# Patient Record
Sex: Male | Born: 1965 | Race: Black or African American | Hispanic: No | Marital: Single | Smoking: Former smoker
Health system: Southern US, Community
[De-identification: ages and names within clinical notes are randomized; demographics above are authoritative.]

## PROBLEM LIST (undated history)

## (undated) DIAGNOSIS — K219 Gastro-esophageal reflux disease without esophagitis: Secondary | ICD-10-CM

## (undated) DIAGNOSIS — Q359 Cleft palate, unspecified: Secondary | ICD-10-CM

## (undated) DIAGNOSIS — I10 Essential (primary) hypertension: Secondary | ICD-10-CM

## (undated) DIAGNOSIS — E669 Obesity, unspecified: Secondary | ICD-10-CM

## (undated) HISTORY — DX: Obesity, unspecified: E66.9

## (undated) HISTORY — DX: Gastro-esophageal reflux disease without esophagitis: K21.9

## (undated) HISTORY — PX: FRACTURE SURGERY: SHX138

## (undated) HISTORY — DX: Cleft palate, unspecified: Q35.9

---

## 2003-07-18 HISTORY — PX: CLEFT PALATE REPAIR: SUR1165

## 2009-08-10 ENCOUNTER — Ambulatory Visit: Payer: Self-pay | Admitting: Cardiology

## 2009-08-10 ENCOUNTER — Ambulatory Visit: Payer: Self-pay | Admitting: Adult Health

## 2010-06-23 ENCOUNTER — Emergency Department (HOSPITAL_COMMUNITY): Admission: EM | Admit: 2010-06-23 | Discharge: 2009-10-30 | Payer: Self-pay | Admitting: Emergency Medicine

## 2010-10-04 LAB — BASIC METABOLIC PANEL
BUN: 10 mg/dL (ref 6–23)
Calcium: 8.8 mg/dL (ref 8.4–10.5)
Chloride: 101 mEq/L (ref 96–112)
GFR calc Af Amer: >60 mL/min (ref >60)
GFR calc non Af Amer: >60 mL/min (ref >60)
Glucose, Bld: 124 mg/dL — ABNORMAL HIGH (ref 70–99)
Potassium: 3.2 mEq/L — ABNORMAL LOW (ref 3.5–5.1)

## 2010-10-04 LAB — CBC
HCT: 42.1 % (ref 39.0–52.0)
MCHC: 34.6 g/dL (ref 30.0–36.0)
Platelets: 263 10*3/uL (ref 150–400)
RDW: 13.5 % (ref 11.5–15.5)

## 2010-10-04 LAB — DIFFERENTIAL
Basophils Absolute: 0 10*3/uL (ref 0.0–0.1)
Basophils Relative: 0 % (ref 0–1)
Eosinophils Relative: 1 % (ref 0–5)
Lymphs Abs: 4.1 10*3/uL — ABNORMAL HIGH (ref 0.7–4.0)
Neutro Abs: 2.5 10*3/uL (ref 1.7–7.7)

## 2010-10-04 LAB — POCT CARDIAC MARKERS

## 2012-04-19 ENCOUNTER — Emergency Department (HOSPITAL_COMMUNITY)
Admission: EM | Admit: 2012-04-19 | Discharge: 2012-04-19 | Disposition: A | Discharge status: Home / Self Care | Payer: Self-pay | Attending: Emergency Medicine | Admitting: Emergency Medicine

## 2012-04-19 ENCOUNTER — Encounter (HOSPITAL_COMMUNITY): Payer: Self-pay | Admitting: Emergency Medicine

## 2012-04-19 DIAGNOSIS — I10 Essential (primary) hypertension: Secondary | ICD-10-CM | POA: Insufficient documentation

## 2012-04-19 DIAGNOSIS — M549 Dorsalgia, unspecified: Secondary | ICD-10-CM | POA: Insufficient documentation

## 2012-04-19 HISTORY — DX: Essential (primary) hypertension: I10

## 2012-04-19 MED ORDER — HYDROMORPHONE HCL PF 2 MG/ML IJ SOLN
2.0000 mg | Freq: Once | INTRAMUSCULAR | Status: AC
  Administered 2012-04-19: 2 mg via INTRAMUSCULAR
  Filled 2012-04-19: qty 1

## 2012-04-19 MED ORDER — OXYCODONE-ACETAMINOPHEN 5-325 MG PO TABS: 1.0000 | ORAL_TABLET | Freq: Four times a day (QID) | ORAL | Status: DC | PRN

## 2012-04-19 MED ORDER — CYCLOBENZAPRINE HCL 10 MG PO TABS: 10.0000 mg | ORAL_TABLET | Freq: Three times a day (TID) | ORAL | Status: DC | PRN

## 2012-04-19 MED ORDER — KETOROLAC TROMETHAMINE 30 MG/ML IJ SOLN
60.0000 mg | Freq: Once | INTRAMUSCULAR | Status: AC
  Administered 2012-04-19: 60 mg via INTRAMUSCULAR
  Filled 2012-04-19: qty 1

## 2012-04-19 MED ORDER — IBUPROFEN 800 MG PO TABS: 800.0000 mg | ORAL_TABLET | Freq: Three times a day (TID) | ORAL | Status: DC | PRN

## 2012-04-19 NOTE — ED Notes (Signed)
MD at bedside. 

## 2012-04-19 NOTE — ED Provider Notes (Signed)
History     CSN: [id number]  Arrival date & time 04/19/12  0302   First MD Initiated Contact with Patient 04/19/12 0310      Chief Complaint  Patient presents with  . Back Pain    (Consider location/radiation/quality/duration/timing/severity/associated sxs/prior treatment) HPI Pt reports started a new job 2 days ago which involves more lifting, bending and pushing than he is accustomed to. States since earlier yesterday he has had worsening moderate to severe, non radiating R lower back pain, not improved with heat, ice or tylenol.   Past Medical History  Diagnosis Date  . Hypertension     History reviewed. No pertinent past surgical history.  History reviewed. No pertinent family history.  History  Substance Use Topics  . Smoking status: Never Smoker   . Smokeless tobacco: Not on file  . Alcohol Use: Yes     Ocassional      Review of Systems All other systems reviewed and are negative except as noted in HPI.   Allergies  Review of patient's allergies indicates no known allergies.  Home Medications  No current outpatient prescriptions on file.  BP 132/86  Pulse 87  Temp 97.9 F (36.6 C) (Oral)  Resp 20  SpO2 98%  Physical Exam  Nursing note and vitals reviewed. Constitutional: He is oriented to person, place, and time. He appears well-developed and well-nourished.  HENT:  Head: Normocephalic and atraumatic.  Eyes: EOM are normal. Pupils are equal, round, and reactive to light.  Neck: Normal range of motion. Neck supple.  Cardiovascular: Normal rate, normal heart sounds and intact distal pulses.   Pulmonary/Chest: Effort normal and breath sounds normal.  Abdominal: Bowel sounds are normal. He exhibits no distension. There is no tenderness.  Musculoskeletal: Normal range of motion. He exhibits tenderness. He exhibits no edema.       Tender to palpation of R lumbar paraspinal muscles. No bony tenderness or tenderness at sciatic notch  Neurological: He is  alert and oriented to person, place, and time. He has normal strength. No cranial nerve deficit or sensory deficit.  Skin: Skin is warm and dry. No rash noted.  Psychiatric: He has a normal mood and affect.    ED Course  Procedures (including critical care time)  Labs Reviewed - No data to display No results found.   No diagnosis found.    MDM  Pain improved with IM meds. Pt ready to go home. Advised rest, heat, muscle relaxers at home.        Charles B. Bernette Mayers, MD 04/19/12 [phone]

## 2012-04-19 NOTE — ED Notes (Signed)
Patient reporting lower back pain that started yesterday; patient reports that he has been putting a lot of strain on his back with his new job.  Patient reports taking Tylenol; provided little to no relief.  Denies injury.

## 2012-04-20 ENCOUNTER — Emergency Department (HOSPITAL_COMMUNITY): Payer: Self-pay

## 2012-04-20 ENCOUNTER — Encounter (HOSPITAL_COMMUNITY): Payer: Self-pay | Admitting: Emergency Medicine

## 2012-04-20 ENCOUNTER — Emergency Department (HOSPITAL_COMMUNITY)
Admission: EM | Admit: 2012-04-20 | Discharge: 2012-04-20 | Disposition: A | Discharge status: Home / Self Care | Payer: Self-pay | Attending: Emergency Medicine | Admitting: Emergency Medicine

## 2012-04-20 DIAGNOSIS — S335XXA Sprain of ligaments of lumbar spine, initial encounter: Secondary | ICD-10-CM | POA: Insufficient documentation

## 2012-04-20 DIAGNOSIS — S39012A Strain of muscle, fascia and tendon of lower back, initial encounter: Secondary | ICD-10-CM

## 2012-04-20 DIAGNOSIS — I10 Essential (primary) hypertension: Secondary | ICD-10-CM | POA: Insufficient documentation

## 2012-04-20 DIAGNOSIS — X500XXA Overexertion from strenuous movement or load, initial encounter: Secondary | ICD-10-CM | POA: Insufficient documentation

## 2012-04-20 MED ORDER — KETOROLAC TROMETHAMINE 60 MG/2ML IM SOLN
60.0000 mg | Freq: Once | INTRAMUSCULAR | Status: AC
  Administered 2012-04-20: 60 mg via INTRAMUSCULAR
  Filled 2012-04-20: qty 2

## 2012-04-20 MED ORDER — HYDROMORPHONE HCL PF 1 MG/ML IJ SOLN
1.0000 mg | Freq: Once | INTRAMUSCULAR | Status: AC
  Administered 2012-04-20: 1 mg via INTRAMUSCULAR
  Filled 2012-04-20: qty 1

## 2012-04-20 NOTE — ED Notes (Signed)
Patient complaining of lower back pain; was seen here yesterday morning for the same symptoms.  Patient reports that he was given two injections for pain, which helped alleviate the pain.  Patient reports that every night around 0200-0300 while he is lying in bed; pain returns.  Patient ambulatory in triage.

## 2012-04-20 NOTE — ED Notes (Signed)
Patient transported to X-ray 

## 2012-04-20 NOTE — ED Notes (Signed)
The patient is AOx4 and comfortable with his discharge instructions.  His ride home is present in the room.

## 2012-04-20 NOTE — ED Provider Notes (Signed)
History     CSN: [id number]  Arrival date & time 04/20/12  6644   First MD Initiated Contact with Patient 04/20/12 [phone]      Chief Complaint  Patient presents with  . Back Pain    (Consider location/radiation/quality/duration/timing/severity/associated sxs/prior treatment) HPI Comments: 46 year old male with a history of low back pain for the last week. He does recently have a switch in occupation which has left him doing some lifting and bending which he did not do before. The pain is located in the right lower back, it is not in the middle, he denies any red flags for back pain including history of cancer, IV drug use, fever, urinary retention or incontinence, weakness or numbness or gait problems. The symptoms are persistent for the last several days though they do get worse when he gets in certain positions. He has received intramuscular medications yesterday with good relief of his symptoms but at home using Percocet Flexeril and ibuprofen he has not had as good of relief. The patient denies any coughing or shortness of breath fevers or chills nausea or vomiting. He has not had a workup for back pain in the past until yesterday  Patient is a 46 y.o. male presenting with back pain. The history is provided by the patient, a relative and medical records.  Back Pain     Past Medical History  Diagnosis Date  . Hypertension     History reviewed. No pertinent past surgical history.  History reviewed. No pertinent family history.  History  Substance Use Topics  . Smoking status: Never Smoker   . Smokeless tobacco: Not on file  . Alcohol Use: Yes     Ocassional      Review of Systems  Musculoskeletal: Positive for back pain.  All other systems reviewed and are negative.    Allergies  Review of patient's allergies indicates no known allergies.  Home Medications   Current Outpatient Rx  Name Route Sig Dispense Refill  . CYCLOBENZAPRINE HCL 10 MG PO TABS Oral Take 1  tablet (10 mg total) by mouth 3 (three) times daily as needed for muscle spasms. 30 tablet 0  . HYDROCHLOROTHIAZIDE PO Oral Take 1 tablet by mouth daily.    . IBUPROFEN 800 MG PO TABS Oral Take 1 tablet (800 mg total) by mouth every 8 (eight) hours as needed for pain. 30 tablet 0  . LISINOPRIL PO Oral Take 1 tablet by mouth daily.    . OXYCODONE-ACETAMINOPHEN 5-325 MG PO TABS Oral Take 1-2 tablets by mouth every 6 (six) hours as needed for pain. 20 tablet 0  . RANITIDINE HCL 150 MG PO TABS Oral Take 150 mg by mouth 2 (two) times daily.      BP 121/72  Pulse 80  Temp 98 F (36.7 C) (Oral)  Resp 22  Ht 6' (1.829 m)  Wt 285 lb (129.275 kg)  BMI 38.65 kg/m2  SpO2 98%  Physical Exam  Nursing note and vitals reviewed. Constitutional: He appears well-developed and well-nourished. No distress.  HENT:  Head: Normocephalic and atraumatic.  Mouth/Throat: Oropharynx is clear and moist. No oropharyngeal exudate.  Eyes: Conjunctivae normal and EOM are normal. Pupils are equal, round, and reactive to light. Right eye exhibits no discharge. Left eye exhibits no discharge. No scleral icterus.  Neck: Normal range of motion. Neck supple. No JVD present. No thyromegaly present.  Cardiovascular: Normal rate, regular rhythm, normal heart sounds and intact distal pulses.  Exam reveals no gallop and no friction  rub.   No murmur heard. Pulmonary/Chest: Effort normal and breath sounds normal. No respiratory distress. He has no wheezes. He has no rales.  Abdominal: Soft. Bowel sounds are normal. He exhibits no distension and no mass. There is no tenderness.  Musculoskeletal: Normal range of motion. He exhibits tenderness. He exhibits no edema.       Tender to palpation on the right lower back in the paraspinal muscles in the right buttock. No tenderness over the cervical thoracic or lumbar spines.  Lymphadenopathy:    He has no cervical adenopathy.  Neurological: He is alert. Coordination normal.        Reflexes normal at the knees bilaterally, normal strength and sensation to light touch and pinprick of the bilateral lower extremities.  Skin: Skin is warm and dry. No rash noted. No erythema.  Psychiatric: He has a normal mood and affect. His behavior is normal.    ED Course  Procedures (including critical care time)  Labs Reviewed - No data to display Dg Lumbar Spine Complete  04/20/2012  *RADIOLOGY REPORT*  Clinical Data: Low back and right leg pain  LUMBAR SPINE - COMPLETE 4+ VIEW  Comparison: None  Findings: There is mild concavity of the endplates throughout the lumbar spine.  No acute fracture.  Normal alignment.  Disc heights maintained throughout.  IMPRESSION:  No acute abnormality   Original Report Authenticated By: Osa Craver, M.D.      1. Lumbar strain       MDM  The patient has a normal neurologic exam, a history that does not suggest a malignant source of his symptoms, vital signs are normal without fever or tachycardia, pain medication ordered, leg radiographs of the lumbar spine pending.   X-rays are negative for acute findings, the patient has had near complete improvement with intramuscular medications. Reassurance given that this is unlikely to be a spinal cord problem however neurosurgical followup given for spine referral should his symptoms continue, the patient has been explained the red flags for back pain and has expressed his understanding as the indications for return.  Vida Roller, MD 04/20/12 [phone]

## 2013-01-06 ENCOUNTER — Ambulatory Visit (INDEPENDENT_AMBULATORY_CARE_PROVIDER_SITE_OTHER): Payer: No Typology Code available for payment source | Admitting: Physician Assistant

## 2013-01-06 ENCOUNTER — Encounter: Payer: Self-pay | Admitting: Physician Assistant

## 2013-01-06 VITALS — BP 116/80 | HR 86 | Temp 98.3°F | Resp 16 | Ht 72.0 in | Wt 276.2 lb

## 2013-01-06 DIAGNOSIS — K219 Gastro-esophageal reflux disease without esophagitis: Secondary | ICD-10-CM

## 2013-01-06 DIAGNOSIS — F1911 Other psychoactive substance abuse, in remission: Secondary | ICD-10-CM

## 2013-01-06 DIAGNOSIS — I10 Essential (primary) hypertension: Secondary | ICD-10-CM

## 2013-01-06 DIAGNOSIS — F1910 Other psychoactive substance abuse, uncomplicated: Secondary | ICD-10-CM

## 2013-01-06 NOTE — Progress Notes (Signed)
Patient ID: Stephen Spears MRN: [id number], DOB: [birth year], 47 y.o. Date of Encounter: 01/06/2013, 4:04 PM  Primary Physician: Default, Provider, MD  Chief Complaint: Establish care  HPI: 47 y.o. male with history below presents to establish care. History of well controlled hypertension and cocaine abuse 15 years prior.   1) Hypertension diagnosed about 10 years earlier. Doing well on current regimen of HCTZ and lisinopril. No side effects. Takes daily. Blood pressure well controlled. 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 No chest pain, chest tightness, HA, vision changes, or focal deficits.   2) History of substance abuse. He used cocaine for a while 15 years ago. "Morning, noon, and night." He decided when his daughter was born it was time to get his life turned around. He has not touched an illegal substance since. He does drink about 2 beers per week. No tobacco use.   3) Establish care: [facility] at a [facility]. He saw different providers each time. He would like to know his cholesterol. He is not fasting today. Plans to schedule a CPE. States his last CPE was about a year ago.      Past Medical History  Diagnosis Date  . Hypertension      Home Meds: Prior to Admission medications   Medication Sig Start Date End Date Taking? Authorizing Provider         HYDROCHLOROTHIAZIDE PO Take 1 tablet by mouth daily.   Yes Historical Provider, MD         LISINOPRIL PO Take 1 tablet by mouth daily.   Yes Historical Provider, MD         ranitidine (ZANTAC) 150 MG tablet Take 150 mg by mouth 2 (two) times daily.   Yes Historical Provider, MD    Allergies: No Known Allergies  History   Social History  . Marital Status: Single    Spouse Name: N/A    Number of Children: N/A  . Years of Education: N/A   Occupational History  . Not on file.   Social History Main Topics  . Smoking status:  Never Smoker   . Smokeless tobacco: Never Used  . Alcohol Use: 0 - 1 oz/week    0-2 drink(s) per week     Comment: Ocassional  . 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   Marland Kitchen Sexually Active: Not on file   Other Topics Concern  . Not on file   Social History Narrative  . No narrative on file     Review of Systems: Constitutional: negative for chills, fever, or fatigue  HEENT: negative for vision changes, hearing loss, congestion, rhinorrhea, ST, epistaxis, or sinus pressure Cardiovascular: negative for chest pain or palpitations Respiratory: negative for wheezing, shortness of breath, or cough Abdominal: negative for abdominal pain, nausea, vomiting, or diarrhea Dermatological: negative for rash Neurologic: negative for headache, dizziness, or syncope   Physical Exam: Blood pressure 116/80, pulse 86, temperature 98.3 F (36.8 C), temperature source Oral, resp. rate 16, height 6' (1.829 m), weight 276 lb 3.2 oz (125.283 kg), SpO2 97.00%., Body mass index is 37.45 kg/(m^2). General: Well developed, well nourished, in no acute distress. Head: Normocephalic, atraumatic, eyes without discharge, sclera non-icteric, nares are without discharge. Bilateral auditory canals clear, TM's are without perforation, pearly grey and translucent with reflective cone of light bilaterally. Cleft palate repair. Birth  mark left side of face. Oral cavity moist, posterior pharynx without exudate, erythema, peritonsillar abscess, or post nasal drip.  Neck: Supple. No thyromegaly. Full ROM. No lymphadenopathy. Lungs: Clear bilaterally to auscultation without wheezes, rales, or rhonchi. Breathing is unlabored. Heart: RRR with S1 S2. No murmurs, rubs, or gallops appreciated. Msk:  Strength and tone normal for age. Extremities/Skin: Warm and dry. No clubbing or cyanosis. No edema. No rashes or suspicious lesions. Neuro: Alert and oriented X 3. Moves all extremities spontaneously.  Gait is normal. CNII-XII grossly in tact. Psych:  Responds to questions appropriately with a normal affect.   Labs:  CMP and lipid pending Patient is not fasting He requested lipid panel  ASSESSMENT AND PLAN:  47 y.o. male with hypertension and history of cocaine abuse here to establish care.  1) Hypertension -Well controlled -Refilled HCTZ 25 mg 1 po daily #30 RF 5 -Refilled Lisinopril 40 mg 1 po daily #30 RF 5 -Healthy diet and exercise -Weight loss  2) History of cocaine abuse -Clean for 15 years -Will continue to hold beta blockers in case of relapse   3) Establish care -Schedule CPE -Await labs   Signed, Eula Listen, PA-C 01/06/2013 4:04 PM

## 2013-01-07 LAB — COMPREHENSIVE METABOLIC PANEL
ALT: 48 U/L (ref 0–53)
Albumin: 4.7 g/dL (ref 3.5–5.2)
BUN: 16 mg/dL (ref 6–23)
CO2: 27 mEq/L (ref 19–32)
Chloride: 97 mEq/L (ref 96–112)
Sodium: 135 mEq/L (ref 135–145)
Total Protein: 7.9 g/dL (ref 6.0–8.3)

## 2013-01-07 LAB — LIPID PANEL
HDL: 40 mg/dL (ref >39)
LDL Cholesterol: 108 mg/dL — ABNORMAL HIGH (ref 0–99)
Triglycerides: 275 mg/dL — ABNORMAL HIGH (ref <150)

## 2013-01-13 ENCOUNTER — Ambulatory Visit: Payer: No Typology Code available for payment source | Admitting: Physician Assistant

## 2013-01-27 ENCOUNTER — Ambulatory Visit (INDEPENDENT_AMBULATORY_CARE_PROVIDER_SITE_OTHER): Payer: No Typology Code available for payment source | Admitting: Physician Assistant

## 2013-01-27 ENCOUNTER — Encounter: Payer: Self-pay | Admitting: Physician Assistant

## 2013-01-27 VITALS — BP 124/78 | HR 87 | Temp 98.2°F | Resp 16 | Ht 71.0 in | Wt 275.0 lb

## 2013-01-27 DIAGNOSIS — I10 Essential (primary) hypertension: Secondary | ICD-10-CM

## 2013-01-27 DIAGNOSIS — K219 Gastro-esophageal reflux disease without esophagitis: Secondary | ICD-10-CM

## 2013-01-27 NOTE — Progress Notes (Signed)
Patient ID: Stephen Spears MRN: [id number], DOB: 12-18-1965, 47 y.o. Date of Encounter: 01/27/2013, 2:54 PM  Primary Physician: Default, Provider, MD  Chief Complaint: Follow up  HPI: 47 y.o. male with history below presents for follow up of hypertension and to have a fasting lipid panel drawn. Doing well. No issues or complaints. Tolerating antihypertensives without issues. No chest pain, chest tightness, headaches, vision changes, or focal deficits. Does not yet need a refill of his medications.   Fasting for FLP. Would like to know what his lipids are. Usually walks each morning, however he has not walked this past week secondary to his work schedule.     Past Medical History  Diagnosis Date  . Hypertension   . Cleft palate      Home Meds: Prior to Admission medications   Medication Sig Start Date End Date Taking? Authorizing Provider  HYDROCHLOROTHIAZIDE PO Take 1 tablet by mouth daily.   Yes Historical Provider, MD  LISINOPRIL PO Take 1 tablet by mouth daily.   Yes Historical Provider, MD  ranitidine (ZANTAC) 150 MG tablet Take 150 mg by mouth 2 (two) times daily.   Yes Historical Provider, MD    Allergies: No Known Allergies  History   Social History  . Marital Status: Single    Spouse Name: N/A    Number of Children: N/A  . Years of Education: N/A   Occupational History  . Not on file.   Social History Main Topics  . Smoking status: Never Smoker   . Smokeless tobacco: Never Used  . Alcohol Use: 0 - 1 oz/week    0-2 drink(s) per week     Comment: Ocassional  . 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   Marland Kitchen Sexually Active: Not on file   Other Topics Concern  . Not on file   Social History Narrative  . No narrative on file     Review of Systems: Constitutional: negative for chills, fever, or fatigue  HEENT: negative for vision changes or hearing loss Cardiovascular: negative for chest pain, chest tightness, edema,  orthopnea, or palpitations Respiratory: negative for wheezing, shortness of breath, or cough Abdominal: negative for abdominal pain, nausea, vomiting, or diarrhea Dermatological: negative for rash Neurologic: negative for headache, dizziness, or syncope   Physical Exam: Blood pressure 124/78, pulse 87, temperature 98.2 F (36.8 C), temperature source Oral, resp. rate 16, height 5\' 11"  (1.803 m), weight 275 lb (124.739 kg), SpO2 98.00%., Body mass index is 38.37 kg/(m^2). General: Well developed, well nourished, in no acute distress. Head: Normocephalic, atraumatic, eyes without discharge, sclera non-icteric, nares are without discharge. Bilateral auditory canals clear, TM's are without perforation, pearly grey and translucent with reflective cone of light bilaterally. Oral cavity moist, posterior pharynx without exudate, erythema, peritonsillar abscess, or post nasal drip.  Neck: Supple. No thyromegaly. Full ROM. No lymphadenopathy. Lungs: Clear bilaterally to auscultation without wheezes, rales, or rhonchi. Breathing is unlabored. Heart: RRR with S1 S2. No murmurs, rubs, or gallops appreciated. Msk:  Strength and tone normal for age. Extremities/Skin: Warm and dry. No clubbing or cyanosis. No edema. No rashes or suspicious lesions. Neuro: Alert and oriented X 3. Moves all extremities spontaneously. Gait is normal. CNII-XII grossly in tact. Psych:  Responds to questions appropriately with a normal affect.   Labs:  FLP pending  ASSESSMENT AND PLAN:  47 y.o. male with hypertension, reflux, and prior history of cocaine abuse.   1) Hypertension -  Well controlled -Continue current treatment -Healthy diet and exercise -Weight loss  2) Reflux -Well controlled -Continue current treatment  3) Prior history of cocaine abuse -Clean for 15 years -Will continue to hold beta blockers in case of relapse  4) Schedule CPE -Await fasting lipids -Healthy lifestyle choices    Signed, Eula Listen, PA-C 01/27/2013 2:54 PM

## 2013-01-28 LAB — LIPID PANEL
Cholesterol: 172 mg/dL (ref 0–200)
HDL: 39 mg/dL — ABNORMAL LOW (ref >39)
LDL Cholesterol: 97 mg/dL (ref 0–99)
Total CHOL/HDL Ratio: 4.4 Ratio
VLDL: 36 mg/dL (ref 0–40)

## 2013-02-03 ENCOUNTER — Other Ambulatory Visit: Payer: Self-pay | Admitting: Physician Assistant

## 2013-02-03 DIAGNOSIS — I10 Essential (primary) hypertension: Secondary | ICD-10-CM

## 2013-02-03 MED ORDER — HYDROCHLOROTHIAZIDE 25 MG PO TABS: 25.0000 mg | ORAL_TABLET | Freq: Every day | ORAL | Status: DC

## 2013-02-03 MED ORDER — LISINOPRIL 20 MG PO TABS: 20.0000 mg | ORAL_TABLET | Freq: Every day | ORAL | Status: DC

## 2013-02-03 NOTE — Progress Notes (Signed)
Patient would like to have refills on file at [facility].

## 2013-03-12 ENCOUNTER — Other Ambulatory Visit: Payer: Self-pay

## 2013-03-12 MED ORDER — RANITIDINE HCL 150 MG PO TABS: 150.0000 mg | ORAL_TABLET | Freq: Two times a day (BID) | ORAL | Status: DC

## 2013-05-22 ENCOUNTER — Other Ambulatory Visit: Payer: Self-pay

## 2013-06-09 ENCOUNTER — Encounter: Payer: Self-pay | Admitting: Physician Assistant

## 2013-06-09 ENCOUNTER — Ambulatory Visit (INDEPENDENT_AMBULATORY_CARE_PROVIDER_SITE_OTHER): Payer: No Typology Code available for payment source | Admitting: Physician Assistant

## 2013-06-09 ENCOUNTER — Other Ambulatory Visit: Payer: Self-pay | Admitting: Physician Assistant

## 2013-06-09 VITALS — BP 110/82 | HR 88 | Temp 98.3°F | Resp 16 | Ht 71.5 in | Wt 277.7 lb

## 2013-06-09 DIAGNOSIS — Z23 Encounter for immunization: Secondary | ICD-10-CM

## 2013-06-09 DIAGNOSIS — E781 Pure hyperglyceridemia: Secondary | ICD-10-CM

## 2013-06-09 DIAGNOSIS — K219 Gastro-esophageal reflux disease without esophagitis: Secondary | ICD-10-CM

## 2013-06-09 DIAGNOSIS — I10 Essential (primary) hypertension: Secondary | ICD-10-CM

## 2013-06-09 LAB — COMPREHENSIVE METABOLIC PANEL
ALT: 56 U/L — ABNORMAL HIGH (ref 0–53)
Albumin: 4.8 g/dL (ref 3.5–5.2)
BUN: 12 mg/dL (ref 6–23)
Chloride: 96 mEq/L (ref 96–112)
Creat: 1.15 mg/dL (ref 0.50–1.35)
Potassium: 3.9 mEq/L (ref 3.5–5.3)
Total Bilirubin: 1.7 mg/dL — ABNORMAL HIGH (ref 0.3–1.2)

## 2013-06-09 LAB — LIPID PANEL
Cholesterol: 186 mg/dL (ref 0–200)
HDL: 41 mg/dL (ref >39)
Total CHOL/HDL Ratio: 4.5 Ratio
Triglycerides: 172 mg/dL — ABNORMAL HIGH (ref <150)

## 2013-06-09 MED ORDER — RANITIDINE HCL 150 MG PO TABS: 150.0000 mg | ORAL_TABLET | Freq: Two times a day (BID) | ORAL | Status: DC

## 2013-06-09 MED ORDER — LISINOPRIL 20 MG PO TABS: 20.0000 mg | ORAL_TABLET | Freq: Every day | ORAL | Status: DC

## 2013-06-09 MED ORDER — HYDROCHLOROTHIAZIDE 25 MG PO TABS: 25.0000 mg | ORAL_TABLET | Freq: Every day | ORAL | Status: DC

## 2013-06-09 NOTE — Progress Notes (Signed)
Patient ID: Stephen Spears MRN: [id number], DOB: [birth year], 47 y.o. Date of Encounter: 06/09/2013, 4:38 PM  Primary Physician: Default, Provider, MD  Chief Complaint: Medication refill  HPI: 47 y.o. male with history below presents for hypertension follow up and to have a fasting lipid panel drawn. Doing well. No issues or complaints. Tolerating antihypertensives without issues. No chest pain, chest tightness, headaches, vision changes, or focal deficits. Needs refills on all of his medications. Diet consists of mix of healthy foods and fast foods. He plans to restart his walking program that he was doing back at the beginning of the summer. He recently changed jobs. He enjoys his new job considerably more.   He started taking fish oil back in July secondary to hypertriglyceridemia. Currently fasting. Reflux is well controlled.    Past Medical History  Diagnosis Date  . Hypertension   . Cleft palate      Home Meds: Prior to Admission medications   Medication Sig Start Date End Date Taking? Authorizing Provider  hydrochlorothiazide (HYDRODIURIL) 25 MG tablet Take 1 tablet (25 mg total) by mouth daily. 06/09/13  Yes Ryan M Dunn, PA-C  lisinopril (PRINIVIL,ZESTRIL) 20 MG tablet Take 1 tablet (20 mg total) by mouth daily. 06/09/13  Yes Ryan M Dunn, PA-C  ranitidine (ZANTAC) 150 MG tablet Take 1 tablet (150 mg total) by mouth 2 (two) times daily. 06/09/13  Yes Ryan M Dunn, PA-C    Allergies: No Known Allergies  History   Social History  . Marital Status: Single    Spouse Name: N/A    Number of Children: N/A  . Years of Education: N/A   Occupational History  . Not on file.   Social History Main Topics  . Smoking status: Never Smoker   . Smokeless tobacco: Never Used  . Alcohol Use: 0 - 1 oz/week    0-2 drink(s) per week     Comment: Ocassional  . 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   Marland Kitchen Sexual Activity: Not on file   Other Topics  Concern  . Not on file   Social History Narrative  . No narrative on file     Family History  Problem Relation Age of Onset  . Hypertension Mother   . Thyroid disease Mother   . Alcohol abuse Father   . Alcohol abuse Sister     Review of Systems: Constitutional: negative for chills, fever, night sweats, weight changes, or fatigue  HEENT: negative for vision changes, hearing loss, congestion, rhinorrhea, ST, or sinus pressure Cardiovascular: negative for chest pain, palpitations, or DOE Respiratory: negative for wheezing, shortness of breath, or cough Abdominal: negative for abdominal pain, nausea, vomiting, diarrhea, or constipation Dermatological: negative for rash Neurologic: negative for headache, dizziness, or syncope   Physical Exam: Blood pressure 110/82, pulse 88, temperature 98.3 F (36.8 C), temperature source Oral, resp. rate 16, height 5' 11.5" (1.816 m), weight 277 lb 11.2 oz (125.964 kg), SpO2 98.00%., Body mass index is 38.2 kg/(m^2). General: Well developed, well nourished, in no acute distress. Head: Normocephalic, atraumatic, eyes without discharge, sclera non-icteric, nares are without discharge. Bilateral auditory canals clear, TM's are without perforation, pearly grey and translucent with reflective cone of light bilaterally. Oral cavity moist, posterior pharynx without exudate, erythema, peritonsillar abscess, or post nasal drip. Cleft palate.   Neck: Supple. No thyromegaly. Full ROM. No lymphadenopathy. No carotid bruits. Lungs: Clear bilaterally to auscultation without wheezes, rales, or rhonchi.  Breathing is unlabored. Heart: RRR with S1 S2. No murmurs, rubs, or gallops appreciated.  Msk:  Strength and tone normal for age. Extremities/Skin: Warm and dry. No clubbing or cyanosis. No edema. No rashes or suspicious lesions. Birth mark along left side of his face.  Neuro: Alert and oriented X 3. Moves all extremities spontaneously. Gait is normal. CNII-XII  grossly in tact. DTR 2+, cerebellar function intact. Rhomberg normal. Psych:  Responds to questions appropriately with a normal affect.   Labs:  CMP and lipid pending  ASSESSMENT AND PLAN:  47 y.o. male with well controlled hypertension and reflux  1) Hypertension -Well controlled -Continue current medications -HCTZ 25 mg 1 po daily #90 RF1 -Lisinopril 20 mg 1 po daily #90 RF 1 -Weight loss -Healthy diet and exercise -Influenza vaccine today  2) Reflux  -Well controlled -Zantac 150 mg 1 po bid #180 RF 1  3) Hypertriglyceridemia -Await lipid panel -Continue fish oil   Signed, Eula Listen, PA-C [facility] [place], [place] [address] [phone] 06/09/2013 4:38 PM

## 2013-07-07 ENCOUNTER — Encounter: Payer: Self-pay | Admitting: Physician Assistant

## 2013-07-07 ENCOUNTER — Ambulatory Visit (INDEPENDENT_AMBULATORY_CARE_PROVIDER_SITE_OTHER): Payer: No Typology Code available for payment source | Admitting: Physician Assistant

## 2013-07-07 VITALS — BP 112/76 | HR 81 | Temp 98.2°F | Resp 16 | Ht 71.0 in | Wt 273.2 lb

## 2013-07-07 DIAGNOSIS — I10 Essential (primary) hypertension: Secondary | ICD-10-CM

## 2013-07-07 DIAGNOSIS — E669 Obesity, unspecified: Secondary | ICD-10-CM

## 2013-07-07 DIAGNOSIS — R17 Unspecified jaundice: Secondary | ICD-10-CM

## 2013-07-07 LAB — COMPREHENSIVE METABOLIC PANEL
AST: 31 U/L (ref 0–37)
Albumin: 4.5 g/dL (ref 3.5–5.2)
BUN: 18 mg/dL (ref 6–23)
Calcium: 9.6 mg/dL (ref 8.4–10.5)
Glucose, Bld: 101 mg/dL — ABNORMAL HIGH (ref 70–99)
Potassium: 4.1 mEq/L (ref 3.5–5.3)
Total Bilirubin: 0.8 mg/dL (ref 0.3–1.2)
Total Protein: 7.2 g/dL (ref 6.0–8.3)

## 2013-07-07 LAB — BILIRUBIN,DIRECT & INDIRECT (FRACTIONATED)
Bilirubin, Direct: 0.2 mg/dL (ref 0.0–0.3)
Indirect Bilirubin: 0.6 mg/dL (ref 0.0–0.9)

## 2013-07-07 NOTE — Progress Notes (Signed)
Patient ID: Stephen Spears MRN: [id number], DOB: 10/24/1965, 47 y.o. Date of Encounter: 07/07/2013, 1:04 PM  Primary Physician: Default, Provider, MD  Chief Complaint: Follow up  HPI: 47 y.o. male with history below presents for follow up of elevated bilirubin of 1.7 that was found on 06/09/13. Fractionated bilirubin revealed an indirect bilirubin of 1.4 and a direct bilirubin of 0.3. At that time he was advised to avoid all ETOH and Tylenol and follow up in 3-4 weeks.  He has stopped all alcohol for the past month. At baseline he would drink 1-3 drinks every 1-2 weeks. He has been eating a lot more healthy and exercising regularly. He has lost 4 pounds since our last visit on 06/09/13 and he feels better. He denies any jaundice.    Past Medical History  Diagnosis Date  . Hypertension   . Cleft palate   . Obesity      Home Meds: Prior to Admission medications   Medication Sig Start Date End Date Taking? Authorizing Provider  hydrochlorothiazide (HYDRODIURIL) 25 MG tablet Take 1 tablet (25 mg total) by mouth daily. 06/09/13  Yes Luci Bellucci M Cece Milhouse, PA-C  lisinopril (PRINIVIL,ZESTRIL) 20 MG tablet Take 1 tablet (20 mg total) by mouth daily. 06/09/13  Yes Stanly Si M Aldrick Derrig, PA-C  ranitidine (ZANTAC) 150 MG tablet Take 1 tablet (150 mg total) by mouth 2 (two) times daily. 06/09/13  Yes Andilynn Delavega M Teddie Curd, PA-C    Allergies: No Known Allergies  History   Social History  . Marital Status: Single    Spouse Name: N/A    Number of Children: N/A  . Years of Education: N/A   Occupational History  . Not on file.   Social History Main Topics  . Smoking status: Never Smoker   . Smokeless tobacco: Never Used  . Alcohol Use: 0 - 1 oz/week    0-2 drink(s) per week     Comment: Ocassional  . 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   Marland Kitchen Sexual Activity: Not on file   Other Topics Concern  . Not on file   Social History Narrative  . No narrative on file     Review of  Systems: Constitutional: negative for chills, fever, or fatigue  HEENT: negative for vision changes, hearing loss, congestion, rhinorrhea, ST, or sinus pressure Cardiovascular: negative for chest pain or palpitations Respiratory: negative for wheezing, shortness of breath, or cough Abdominal: negative for abdominal pain, nausea, vomiting, diarrhea, or constipation Dermatological: negative for rash Neurologic: negative for headache, dizziness, or syncope   Physical Exam: Blood pressure 112/76, pulse 81, temperature 98.2 F (36.8 C), temperature source Oral, resp. rate 16, height 5\' 11"  (1.803 m), weight 273 lb 3.2 oz (123.923 kg), SpO2 98.00%., Body mass index is 38.12 kg/(m^2). General: Well developed, well nourished, in no acute distress. Head: Normocephalic, with cleft palate, eyes without discharge, sclera non-icteric, nares are without discharge. Bilateral auditory canals clear, TM's are without perforation, pearly grey and translucent with reflective cone of light bilaterally. Oral cavity moist, posterior pharynx without exudate, erythema, peritonsillar abscess, or post nasal drip. Uvula midline.  Neck: Supple. No thyromegaly. Full ROM. No lymphadenopathy. Lungs: Clear bilaterally to auscultation without wheezes, rales, or rhonchi. Breathing is unlabored. Heart: RRR with S1 S2. No murmurs, rubs, or gallops appreciated. Msk:  Strength and tone normal for age. Extremities/Skin: Warm and dry. No clubbing or cyanosis. No edema. Erythema along the left side of the face.  Neuro: Alert and oriented X 3. Moves all extremities spontaneously. Gait is normal. CNII-XII grossly in tact. Psych:  Responds to questions appropriately with a normal affect.   Labs: CMP and fractionated bilirubin pending  ASSESSMENT AND PLAN:  47 y.o. male with elevated bilirubin, HTN, obesity  1) Elevated bilirubin  -Await labs -Continue to hold ETOH and Tylenol  -Healthy diet and exercise -Weight loss  2)  HTN -Well controlled -Continue current treatment -Follow up 6 months, unless needed sooner for the above  3) Obesity -Patient is working hard on diet and exercise -He has lost 4 pounds since our last visit on 06/09/13 -Healthy diet and exercise -Weight loss   Signed, Eula Listen, PA-C [facility] [place], [place] [address] [phone] 07/07/2013 1:04 PM

## 2013-12-08 ENCOUNTER — Ambulatory Visit (INDEPENDENT_AMBULATORY_CARE_PROVIDER_SITE_OTHER): Payer: No Typology Code available for payment source | Admitting: Physician Assistant

## 2013-12-08 VITALS — BP 124/80 | HR 99 | Temp 98.3°F | Resp 18 | Ht 72.0 in | Wt 282.0 lb

## 2013-12-08 DIAGNOSIS — K219 Gastro-esophageal reflux disease without esophagitis: Secondary | ICD-10-CM

## 2013-12-08 DIAGNOSIS — E669 Obesity, unspecified: Secondary | ICD-10-CM

## 2013-12-08 DIAGNOSIS — I10 Essential (primary) hypertension: Secondary | ICD-10-CM

## 2013-12-08 LAB — BASIC METABOLIC PANEL
BUN: 20 mg/dL (ref 6–23)
CALCIUM: 9.7 mg/dL (ref 8.4–10.5)
CO2: 26 meq/L (ref 19–32)
CREATININE: 1.32 mg/dL (ref 0.50–1.35)
Chloride: 101 mEq/L (ref 96–112)
GLUCOSE: 115 mg/dL — AB (ref 70–99)
Potassium: 4.1 mEq/L (ref 3.5–5.3)
Sodium: 137 mEq/L (ref 135–145)

## 2013-12-08 MED ORDER — RANITIDINE HCL 150 MG PO TABS: 150.0000 mg | ORAL_TABLET | Freq: Two times a day (BID) | ORAL | Status: DC

## 2013-12-08 MED ORDER — HYDROCHLOROTHIAZIDE 25 MG PO TABS: 25.0000 mg | ORAL_TABLET | Freq: Every day | ORAL | Status: DC

## 2013-12-08 MED ORDER — LISINOPRIL 20 MG PO TABS: 20.0000 mg | ORAL_TABLET | Freq: Every day | ORAL | Status: DC

## 2013-12-08 NOTE — Progress Notes (Signed)
Subjective:    Patient ID: Stephen Spears, male    DOB: 05/25/1966, 48 y.o.   MRN: [id number]  HPI Primary Physician: Default, Provider, MD  Chief Complaint: Follow up HTN  HPI: 48 y.o. male with history below presents for follow up hypertension. Currently taking HCTZ 25 mg daily and lisinopril 20 mg daily. Tolerating medications without issues. No chest pains, chest tightness, headaches, vision changes, or focal deficits.   Trying to lose wight, but instead he is gaining weight. He will start to eat right and exercise, but he will quit. Girlfriend brings home [employer] for dinner quite frequently. He knows he must commit to eating right and is trying to get into the correct mind set. Recently got an exercise bike.    Past Medical History  Diagnosis Date  . Hypertension   . Cleft palate   . Obesity      Home Meds: Prior to Admission medications   Medication Sig Start Date End Date Taking? Authorizing Provider  hydrochlorothiazide (HYDRODIURIL) 25 MG tablet Take 1 tablet (25 mg total) by mouth daily. 06/09/13  Yes Madilyne Tadlock M Lakeena Downie, PA-C  lisinopril (PRINIVIL,ZESTRIL) 20 MG tablet Take 1 tablet (20 mg total) by mouth daily. 06/09/13  Yes Quy Lotts M Arnetra Terris, PA-C  ranitidine (ZANTAC) 150 MG tablet Take 1 tablet (150 mg total) by mouth 2 (two) times daily. 06/09/13  Yes Dekari Bures M Quinten Allerton, PA-C    Allergies: No Known Allergies  History   Social History  . Marital Status: Single    Spouse Name: N/A    Number of Children: N/A  . Years of Education: N/A   Occupational History  . Not on file.   Social History Main Topics  . Smoking status: Never Smoker   . Smokeless tobacco: Never Used  . Alcohol Use: 0.0 - 1.0 oz/week    0-2 drink(s) per week     Comment: Ocassional  . 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   Marland Kitchen. Sexual Activity: Not on file   Other Topics Concern  . Not on file   Social History Narrative  . No narrative on file     Review of Systems    Constitutional: Negative for fever, chills and fatigue.  Eyes: Negative for visual disturbance.  Cardiovascular: Negative for chest pain and palpitations.  Gastrointestinal:       Negative for reflux.   Neurological: Negative for headaches.       Objective:   Physical Exam  Physical Exam: Blood pressure 124/80, pulse 99, temperature 98.3 F (36.8 C), temperature source Oral, resp. rate 18, height 6' (1.829 m), weight 282 lb (127.914 kg), SpO2 97.00%., Body mass index is 38.24 kg/(m^2). General: Well developed, well nourished, in no acute distress. Head: Normocephalic, atraumatic, eyes without discharge, sclera non-icteric, nares are without discharge. Bilateral auditory canals clear, TM's are without perforation, pearly grey and translucent with reflective cone of light bilaterally. Oral cavity moist, posterior pharynx without exudate, erythema, peritonsillar abscess, or post nasal drip. Uvula midline.   Neck: Supple. No thyromegaly. Full ROM. No lymphadenopathy. Lungs: Clear bilaterally to auscultation without wheezes, rales, or rhonchi. Breathing is unlabored. Heart: RRR with S1 S2. No murmurs, rubs, or gallops appreciated. Msk:  Strength and tone normal for age. Extremities/Skin: Warm and dry. No clubbing or cyanosis. No edema. No rashes. Birth mark along the left side of his face.  Neuro: Alert and oriented X 3. Moves all extremities spontaneously. Gait is normal.  CNII-XII grossly in tact. Psych:  Responds to questions appropriately with a normal affect.    BMP pending     Assessment & Plan:  48 year old male with hypertension, reflux, and obesity  1) Hypertension -Well controlled -HCTZ 25 mg daily #90 RF 1 -Lisinopril 20 mg daily #90 RF 1 -Healthy diet and exercise -Weight loss -Avoid beta blockers secondary to history of cocaine use  2) Reflux -Well controlled -Zantac 150 mg bid #180 RF 1 -Avoid triggers   3) Obesity -Offered dietitian, patient declines -Patient  knows he must commit to eating the right foods -Healthy diet and exercise -Weight loss -Patient can call for dietitian at any time   Eula Listen, MHS, PA-C [facility] [address] [place], [place] [address] [phone] [facility] 12/08/2013 10:16 AM

## 2014-06-04 ENCOUNTER — Ambulatory Visit (INDEPENDENT_AMBULATORY_CARE_PROVIDER_SITE_OTHER): Payer: No Typology Code available for payment source | Admitting: Family Medicine

## 2014-06-04 VITALS — BP 112/72 | HR 71 | Temp 98.0°F | Resp 18 | Ht 72.0 in | Wt 262.0 lb

## 2014-06-04 DIAGNOSIS — I10 Essential (primary) hypertension: Secondary | ICD-10-CM

## 2014-06-04 DIAGNOSIS — Z131 Encounter for screening for diabetes mellitus: Secondary | ICD-10-CM

## 2014-06-04 DIAGNOSIS — K219 Gastro-esophageal reflux disease without esophagitis: Secondary | ICD-10-CM

## 2014-06-04 DIAGNOSIS — R739 Hyperglycemia, unspecified: Secondary | ICD-10-CM

## 2014-06-04 DIAGNOSIS — Z23 Encounter for immunization: Secondary | ICD-10-CM

## 2014-06-04 LAB — POCT URINALYSIS DIPSTICK
Bilirubin, UA: NEGATIVE
Glucose, UA: NEGATIVE
Ketones, UA: NEGATIVE
LEUKOCYTES UA: NEGATIVE
Nitrite, UA: NEGATIVE
PH UA: 6.5
PROTEIN UA: NEGATIVE
RBC UA: NEGATIVE
Spec Grav, UA: 1.015
Urobilinogen, UA: 1

## 2014-06-04 MED ORDER — LISINOPRIL 20 MG PO TABS: 20.0000 mg | ORAL_TABLET | Freq: Every day | ORAL | Status: DC

## 2014-06-04 MED ORDER — RANITIDINE HCL 150 MG PO TABS: 150.0000 mg | ORAL_TABLET | Freq: Two times a day (BID) | ORAL | Status: DC

## 2014-06-04 MED ORDER — HYDROCHLOROTHIAZIDE 25 MG PO TABS: 25.0000 mg | ORAL_TABLET | Freq: Every day | ORAL | Status: DC

## 2014-06-04 NOTE — Progress Notes (Signed)
Subjective:  This chart was scribed for Stephen SimmerKristi Trissa Molina, MD by Evon Slackerrance Branch, ED Scribe. This Patient was seen in room 08 and the patients care was started at 8:54 PM   Patient ID: Stephen Spears, male    DOB: 10/14/1965, 48 y.o.   MRN: [id number]  06/04/2014  rx refills; Hypertension; and Gastrophageal Reflux   HPI HPI Comments: Stephen Spears is a 48 y.o. male with PMHx HTN and cleft palate who [facility] medication [facility]ill. Pt states that he doesn't check his blood pressure regularly at home. Pt states that he has recently been exercising more and eating healthier and has lost 20 pounds since last visit. Pt states that he exercises 2x per week. Pt states that he is not a smoker. He states that he drinks alcohol occasionally. Pt states that his flu vaccination is not up to date. Denies CP, palpitations, HA, dizziness, SOB, cough, or leg swelling.   Pt reports that Zantac is working well for GERD symptoms.     Review of Systems  Constitutional: Negative for fever, chills, diaphoresis, activity change, appetite change and fatigue.  Eyes: Negative for visual disturbance.  Respiratory: Negative for cough and shortness of breath.   Cardiovascular: Negative for chest pain, palpitations and leg swelling.  Gastrointestinal: Negative for nausea, vomiting, abdominal pain, diarrhea, constipation and blood in stool.  Endocrine: Negative for cold intolerance, heat intolerance, polydipsia, polyphagia and polyuria.  Neurological: Negative for dizziness, tremors, seizures, syncope, facial asymmetry, speech difficulty, weakness, light-headedness, numbness and headaches.    Past Medical History  Diagnosis Date  . Hypertension   . Cleft palate   . Obesity   . GERD (gastroesophageal reflux disease)    Past Surgical History  Procedure Laterality Date  . Cleft palate repair  2005   No Known Allergies Current Outpatient Prescriptions  Medication Sig  Dispense Refill  . hydrochlorothiazide (HYDRODIURIL) 25 MG tablet Take 1 tablet (25 mg total) by mouth daily. 90 tablet 1  . lisinopril (PRINIVIL,ZESTRIL) 20 MG tablet Take 1 tablet (20 mg total) by mouth daily. 90 tablet 1  . ranitidine (ZANTAC) 150 MG tablet Take 1 tablet (150 mg total) by mouth 2 (two) times daily. 180 tablet 1   No current facility-administered medications for this visit.       Objective:    BP 112/72 mmHg  Pulse 71  Temp(Src) 98 F (36.7 C) (Oral)  Resp 18  Ht 6' (1.829 m)  Wt 262 lb (118.842 kg)  BMI 35.53 kg/m2  SpO2 99%   Physical Exam  Constitutional: He is oriented to person, place, and time. He appears well-developed and well-nourished. No distress.  HENT:  Head: Normocephalic and atraumatic.  Right Ear: External ear normal.  Left Ear: External ear normal.  Nose: Nose normal.  Mouth/Throat: Oropharynx is clear and moist.  L sided facial deformity present secondary to repaired cleft palate.  Eyes: Conjunctivae and EOM are normal. Pupils are equal, round, and reactive to light.  Neck: Normal range of motion. Neck supple. Carotid bruit is not present. No tracheal deviation present. No thyromegaly present.  Cardiovascular: Normal rate, regular rhythm, normal heart sounds and intact distal pulses.  Exam reveals no gallop and no friction rub.   No murmur heard. Pulmonary/Chest: Effort normal and breath sounds normal. No respiratory distress. He has no wheezes. He has no rales.  Abdominal: Soft. Bowel sounds are normal. He exhibits no distension and no mass. There is no tenderness.  There is no rebound and no guarding.  Musculoskeletal: Normal range of motion.  Lymphadenopathy:    He has no cervical adenopathy.  Neurological: He is alert and oriented to person, place, and time. No cranial nerve deficit.  Skin: Skin is warm and dry. No rash noted. He is not diaphoretic.  Psychiatric: He has a normal mood and affect. His behavior is normal.  Nursing note  and vitals reviewed.    INFLUENZA VACCINE ADMINISTERED.     Assessment & Plan:   1. Essential hypertension, benign   2. Hyperglycemia   3. Screening for diabetes mellitus   4. Essential hypertension   5. Gastroesophageal reflux disease without esophagitis   6. Flu vaccine need      1. HTN: controlled; obtain labs; refills provided; RTC six months for CPE. 2.  Hyperglycemia: New at last visit; obtain labs with HgbA1c; recommend continued weight loss. 3.  GERD: controlled; refill of Zantac provided; further weight loss with help decrease frequency of symptoms. 4. S/p flu vaccine.    Meds ordered this encounter  Medications  . hydrochlorothiazide (HYDRODIURIL) 25 MG tablet    Sig: Take 1 tablet (25 mg total) by mouth daily.    Dispense:  90 tablet    Refill:  1  . lisinopril (PRINIVIL,ZESTRIL) 20 MG tablet    Sig: Take 1 tablet (20 mg total) by mouth daily.    Dispense:  90 tablet    Refill:  1  . ranitidine (ZANTAC) 150 MG tablet    Sig: Take 1 tablet (150 mg total) by mouth 2 (two) times daily.    Dispense:  180 tablet    Refill:  1    Return in about 6 months (around 12/03/2014) for complete physical examiniation.    I personally performed the services described in this documentation, which was scribed in my presence. The recorded information has been reviewed and considered.  Stephen SimmerKristi Jermey Closs, M.D.  [facility]  [place] [address] [place], [place]  [id number] [phone] phone [phone] fax

## 2014-06-04 NOTE — Patient Instructions (Signed)

## 2014-06-05 LAB — COMPREHENSIVE METABOLIC PANEL
ALBUMIN: 4.8 g/dL (ref 3.5–5.2)
ALT: 37 U/L (ref 0–53)
AST: 33 U/L (ref 0–37)
Alkaline Phosphatase: 66 U/L (ref 39–117)
BILIRUBIN TOTAL: 1.2 mg/dL (ref 0.2–1.2)
BUN: 18 mg/dL (ref 6–23)
CO2: 29 meq/L (ref 19–32)
CREATININE: 1.17 mg/dL (ref 0.50–1.35)
Calcium: 10.1 mg/dL (ref 8.4–10.5)
Chloride: 100 mEq/L (ref 96–112)
Glucose, Bld: 104 mg/dL — ABNORMAL HIGH (ref 70–99)
Potassium: 3.8 mEq/L (ref 3.5–5.3)
SODIUM: 139 meq/L (ref 135–145)
TOTAL PROTEIN: 7.6 g/dL (ref 6.0–8.3)

## 2014-06-05 LAB — CBC WITH DIFFERENTIAL/PLATELET
BASOS PCT: 0 % (ref 0–1)
Basophils Absolute: 0 10*3/uL (ref 0.0–0.1)
EOS PCT: 1 % (ref 0–5)
Eosinophils Absolute: 0.1 10*3/uL (ref 0.0–0.7)
HEMATOCRIT: 42 % (ref 39.0–52.0)
Hemoglobin: 14.4 g/dL (ref 13.0–17.0)
LYMPHS ABS: 3 10*3/uL (ref 0.7–4.0)
Lymphocytes Relative: 57 % — ABNORMAL HIGH (ref 12–46)
MCH: 29.9 pg (ref 26.0–34.0)
MCHC: 34.3 g/dL (ref 30.0–36.0)
MCV: 87.3 fL (ref 78.0–100.0)
MONOS PCT: 9 % (ref 3–12)
MPV: 9.3 fL — ABNORMAL LOW (ref 9.4–12.4)
Monocytes Absolute: 0.5 10*3/uL (ref 0.1–1.0)
NEUTROS PCT: 33 % — AB (ref 43–77)
Neutro Abs: 1.7 10*3/uL (ref 1.7–7.7)
PLATELETS: 273 10*3/uL (ref 150–400)
RBC: 4.81 MIL/uL (ref 4.22–5.81)
RDW: 13.1 % (ref 11.5–15.5)
WBC: 5.2 10*3/uL (ref 4.0–10.5)

## 2014-06-05 LAB — LIPID PANEL
Cholesterol: 175 mg/dL (ref 0–200)
HDL: 46 mg/dL (ref >39)
LDL Cholesterol: 108 mg/dL — ABNORMAL HIGH (ref 0–99)
TRIGLYCERIDES: 105 mg/dL (ref <150)
Total CHOL/HDL Ratio: 3.8 Ratio
VLDL: 21 mg/dL (ref 0–40)

## 2014-06-05 LAB — TSH: TSH: 1.453 u[IU]/mL (ref 0.350–4.500)

## 2014-06-05 LAB — HEMOGLOBIN A1C
Hgb A1c MFr Bld: 6.1 % — ABNORMAL HIGH (ref <5.7)
Mean Plasma Glucose: 128 mg/dL — ABNORMAL HIGH (ref <117)

## 2014-12-01 ENCOUNTER — Encounter: Payer: Self-pay | Admitting: Family Medicine

## 2014-12-01 ENCOUNTER — Ambulatory Visit (INDEPENDENT_AMBULATORY_CARE_PROVIDER_SITE_OTHER): Payer: 59 | Admitting: Family Medicine

## 2014-12-01 VITALS — BP 120/80 | HR 80 | Temp 98.6°F | Resp 16 | Ht 71.5 in | Wt 252.8 lb

## 2014-12-01 DIAGNOSIS — K219 Gastro-esophageal reflux disease without esophagitis: Secondary | ICD-10-CM | POA: Diagnosis not present

## 2014-12-01 DIAGNOSIS — I10 Essential (primary) hypertension: Secondary | ICD-10-CM

## 2014-12-01 DIAGNOSIS — E669 Obesity, unspecified: Secondary | ICD-10-CM

## 2014-12-01 LAB — HEMOGLOBIN A1C
Hgb A1c MFr Bld: 5.7 % — ABNORMAL HIGH (ref <5.7)
Mean Plasma Glucose: 117 mg/dL — ABNORMAL HIGH (ref <117)

## 2014-12-01 LAB — BASIC METABOLIC PANEL
BUN: 15 mg/dL (ref 6–23)
CHLORIDE: 101 meq/L (ref 96–112)
CO2: 25 meq/L (ref 19–32)
Calcium: 9.8 mg/dL (ref 8.4–10.5)
Creat: 1.14 mg/dL (ref 0.50–1.35)
GLUCOSE: 105 mg/dL — AB (ref 70–99)
Potassium: 4.1 mEq/L (ref 3.5–5.3)
SODIUM: 137 meq/L (ref 135–145)

## 2014-12-01 MED ORDER — RANITIDINE HCL 150 MG PO TABS: 150.0000 mg | ORAL_TABLET | Freq: Two times a day (BID) | ORAL | Status: DC

## 2014-12-01 MED ORDER — HYDROCHLOROTHIAZIDE 25 MG PO TABS: 25.0000 mg | ORAL_TABLET | Freq: Every day | ORAL | Status: DC

## 2014-12-01 MED ORDER — LISINOPRIL 20 MG PO TABS: 20.0000 mg | ORAL_TABLET | Freq: Every day | ORAL | Status: DC

## 2014-12-01 NOTE — Progress Notes (Signed)
   Subjective:    Patient ID: Stephen Spears, male    DOB: 10/18/1965, 49 y.o.   MRN: [id number]  HPI Patient presents today for follow up of HTN and GERD. He has lost 10 more pounds since 11/15. He is watching his diet and exercising more. His daughter is very encouraging. He denies any complaints today and reports that he feels good. HgbA1c 11/15- 6.1  Past Medical History  Diagnosis Date  . Hypertension   . Cleft palate   . Obesity   . GERD (gastroesophageal reflux disease)    Past Surgical History  Procedure Laterality Date  . Cleft palate repair  2005   Family History  Problem Relation Age of Onset  . Hypertension Mother   . Thyroid disease Mother   . Alcohol abuse Father   . Alcohol abuse Sister   . Hypertension Sister   . Hypertension Sister   . Hypertension Sister   . Hypertension Sister   . Hypertension Sister   . Hypertension Sister    History  Substance Use Topics  . Smoking status: Former Games developermoker  . Smokeless tobacco: Never Used     Comment: quit in 2007  . Alcohol Use: 0.0 - 1.2 oz/week    0-2 Standard drinks or equivalent per week     Comment: Ocassional   Medications, allergies, past medical history, surgical history, family history, social history and problem list reviewed and updated.  Review of Systems No chest pain, no SOB, no swelling, no acid reflux symptoms, no nausea, no vomiting.     Objective:   Physical Exam Physical Exam  Constitutional: Oriented to person, place, and time. He appears well-developed and well-nourished.  HENT:  Head: Patient with left sided facial deformity. Eyes: Conjunctivae are normal.  Neck: Normal range of motion. Neck supple.  Cardiovascular: Normal rate, regular rhythm and normal heart sounds.   Pulmonary/Chest: Effort normal and breath sounds normal.  Musculoskeletal: Normal range of motion.  Neurological: Alert and oriented to person, place, and time.  Skin: Skin is warm and dry.  Psychiatric: Normal mood  and affect. Behavior is normal. Judgment and thought content normal.  Vitals reviewed.  BP 120/80 mmHg  Pulse 80  Temp(Src) 98.6 F (37 C) (Oral)  Resp 16  Ht 5' 11.5" (1.816 m)  Wt 252 lb 12.8 oz (114.669 kg)  BMI 34.77 kg/m2  SpO2 99%. Wt Readings from Last 3 Encounters:  12/01/14 252 lb 12.8 oz (114.669 kg)  06/04/14 262 lb (118.842 kg)  12/08/13 282 lb (127.914 kg)       Assessment & Plan:  1. Essential hypertension - hydrochlorothiazide (HYDRODIURIL) 25 MG tablet; Take 1 tablet (25 mg total) by mouth daily.  Dispense: 90 tablet; Refill: 1 - lisinopril (PRINIVIL,ZESTRIL) 20 MG tablet; Take 1 tablet (20 mg total) by mouth daily.  Dispense: 90 tablet; Refill: 1 - Basic metabolic panel  2. Gastroesophageal reflux disease without esophagitis - ranitidine (ZANTAC) 150 MG tablet; Take 1 tablet (150 mg total) by mouth 2 (two) times daily.  Dispense: 180 tablet; Refill: 1  3. Obesity - Hemoglobin A1c - celebrated continued weight loss and encouraged continued exercise and healthy food choices.  - follow up in 6 months.  Olean Reeeborah Brix Brearley, FNP-BC  [facility], [facility]  12/01/2014 9:38 AM

## 2014-12-01 NOTE — Patient Instructions (Signed)
Keep up the good work with your diet and exercise I will send you a Mychart message with your lab results Follow up in 6 months

## 2015-06-03 ENCOUNTER — Other Ambulatory Visit: Payer: Self-pay | Admitting: Family Medicine

## 2015-06-04 ENCOUNTER — Encounter: Payer: Self-pay | Admitting: Family Medicine

## 2015-06-04 ENCOUNTER — Ambulatory Visit (INDEPENDENT_AMBULATORY_CARE_PROVIDER_SITE_OTHER): Payer: 59 | Admitting: Family Medicine

## 2015-06-04 VITALS — BP 120/80 | HR 79 | Temp 98.8°F | Resp 16 | Ht 71.75 in | Wt 258.8 lb

## 2015-06-04 DIAGNOSIS — K219 Gastro-esophageal reflux disease without esophagitis: Secondary | ICD-10-CM | POA: Diagnosis not present

## 2015-06-04 DIAGNOSIS — Z6835 Body mass index (BMI) 35.0-35.9, adult: Secondary | ICD-10-CM

## 2015-06-04 DIAGNOSIS — Z23 Encounter for immunization: Secondary | ICD-10-CM

## 2015-06-04 DIAGNOSIS — R739 Hyperglycemia, unspecified: Secondary | ICD-10-CM

## 2015-06-04 DIAGNOSIS — I10 Essential (primary) hypertension: Secondary | ICD-10-CM | POA: Diagnosis not present

## 2015-06-04 LAB — BASIC METABOLIC PANEL
BUN: 17 mg/dL (ref 7–25)
CHLORIDE: 102 mmol/L (ref 98–110)
CO2: 25 mmol/L (ref 20–31)
Calcium: 9.5 mg/dL (ref 8.6–10.3)
Creat: 1.18 mg/dL (ref 0.60–1.35)
Glucose, Bld: 92 mg/dL (ref 65–99)
POTASSIUM: 4.2 mmol/L (ref 3.5–5.3)
Sodium: 135 mmol/L (ref 135–146)

## 2015-06-04 MED ORDER — LISINOPRIL 20 MG PO TABS: 20.0000 mg | ORAL_TABLET | Freq: Every day | ORAL | Status: DC

## 2015-06-04 MED ORDER — RANITIDINE HCL 150 MG PO TABS: 150.0000 mg | ORAL_TABLET | Freq: Two times a day (BID) | ORAL | Status: DC

## 2015-06-04 MED ORDER — HYDROCHLOROTHIAZIDE 25 MG PO TABS: 25.0000 mg | ORAL_TABLET | Freq: Every day | ORAL | Status: DC

## 2015-06-04 NOTE — Progress Notes (Signed)
   Subjective:    Patient ID: Stephen Spears, male    DOB: 08/22/1965, 49 y.o.   MRN: [id number]  HPI Patient presents today for follow up of HTN, elevated blood sugar. He has been working more recently and not able to eat as well. No problems with GERD. Has not been able to exercise like he would like due to work schedule. Is very concerned about weight gain. He tries to bring food instead of eating fast food.   Past Medical History  Diagnosis Date  . Hypertension   . Cleft palate   . Obesity   . GERD (gastroesophageal reflux disease)    Past Surgical History  Procedure Laterality Date  . Cleft palate repair  2005   Family History  Problem Relation Age of Onset  . Hypertension Mother   . Thyroid disease Mother   . Alcohol abuse Father   . Alcohol abuse Sister   . Hypertension Sister   . Hypertension Sister   . Hypertension Sister   . Hypertension Sister   . Hypertension Sister   . Hypertension Sister    Social History  Substance Use Topics  . Smoking status: Former Games developermoker  . Smokeless tobacco: Never Used     Comment: quit in 2007  . Alcohol Use: 0.0 - 1.2 oz/week    0-2 Standard drinks or equivalent per week     Comment: Ocassional   Review of Systems No chest pain, no SOB, no edema    Objective:   Physical Exam Physical Exam  Constitutional: Oriented to person, place, and time. He appears well-developed and well-nourished.  HENT:  Head: Normocephalic and atraumatic.  Eyes: Conjunctivae are normal.  Neck: Normal range of motion. Neck supple.  Cardiovascular: Normal rate, regular rhythm and normal heart sounds.   Pulmonary/Chest: Effort normal and breath sounds normal.  Musculoskeletal: Normal range of motion. No edema.  Neurological: Alert and oriented to person, place, and time.  Skin: Skin is warm and dry.  Psychiatric: Normal mood and affect. Behavior is normal. Judgment and thought content normal.  Vitals reviewed.  BP 120/80 mmHg  Pulse 79   Temp(Src) 98.8 F (37.1 C) (Oral)  Resp 16  Ht 5' 11.75" (1.822 m)  Wt 258 lb 12.8 oz (117.391 kg)  BMI 35.36 kg/m2  SpO2 99% Wt Readings from Last 3 Encounters:  06/04/15 258 lb 12.8 oz (117.391 kg)  12/01/14 252 lb 12.8 oz (114.669 kg)  06/04/14 262 lb (118.842 kg)      Assessment & Plan:  1. Flu vaccine need - Flu Vaccine QUAD 36+ mos IM  2. Essential hypertension - well controlled on current meds - hydrochlorothiazide (HYDRODIURIL) 25 MG tablet; Take 1 tablet (25 mg total) by mouth daily.  Dispense: 90 tablet; Refill: 1 - lisinopril (PRINIVIL,ZESTRIL) 20 MG tablet; Take 1 tablet (20 mg total) by mouth daily.  Dispense: 90 tablet; Refill: 1 - Basic metabolic panel  3. Hyperglycemia - Basic metabolic panel  4. Gastroesophageal reflux disease without esophagitis - ranitidine (ZANTAC) 150 MG tablet; Take 1 tablet (150 mg total) by mouth 2 (two) times daily.  Dispense: 180 tablet; Refill: 1  5. BMI 35.0-35.9,adult - discussed strategies for healthy food choices, incorporating small amounts of exercise into routine.   - follow up in 6 months for CPE  Olean Reeeborah Kishan [place]achsmuth, FNP-BC  [facility], [facility]  06/04/2015 10:58 AM

## 2015-06-04 NOTE — Patient Instructions (Signed)
Keep trying to make healthy food choices and increase your exercise even if it is only 10 minutes a couple of times a week

## 2015-12-06 ENCOUNTER — Ambulatory Visit: Payer: 59 | Admitting: Family Medicine

## 2015-12-07 ENCOUNTER — Ambulatory Visit (INDEPENDENT_AMBULATORY_CARE_PROVIDER_SITE_OTHER): Payer: BLUE CROSS/BLUE SHIELD | Admitting: Family Medicine

## 2015-12-07 ENCOUNTER — Encounter: Payer: Self-pay | Admitting: Family Medicine

## 2015-12-07 VITALS — BP 97/64 | HR 84 | Temp 98.7°F | Resp 16 | Ht 72.0 in | Wt 224.0 lb

## 2015-12-07 DIAGNOSIS — Z0000 Encounter for general adult medical examination without abnormal findings: Secondary | ICD-10-CM

## 2015-12-07 DIAGNOSIS — I10 Essential (primary) hypertension: Secondary | ICD-10-CM | POA: Diagnosis not present

## 2015-12-07 DIAGNOSIS — R739 Hyperglycemia, unspecified: Secondary | ICD-10-CM | POA: Diagnosis not present

## 2015-12-07 DIAGNOSIS — K219 Gastro-esophageal reflux disease without esophagitis: Secondary | ICD-10-CM | POA: Diagnosis not present

## 2015-12-07 DIAGNOSIS — Z23 Encounter for immunization: Secondary | ICD-10-CM

## 2015-12-07 DIAGNOSIS — E669 Obesity, unspecified: Secondary | ICD-10-CM

## 2015-12-07 LAB — COMPLETE METABOLIC PANEL WITH GFR
ALT: 20 U/L (ref 9–46)
AST: 21 U/L (ref 10–40)
Albumin: 4.7 g/dL (ref 3.6–5.1)
Alkaline Phosphatase: 67 U/L (ref 40–115)
BILIRUBIN TOTAL: 1.9 mg/dL — AB (ref 0.2–1.2)
BUN: 14 mg/dL (ref 7–25)
CO2: 24 mmol/L (ref 20–31)
CREATININE: 0.96 mg/dL (ref 0.60–1.35)
Calcium: 9.9 mg/dL (ref 8.6–10.3)
Chloride: 95 mmol/L — ABNORMAL LOW (ref 98–110)
GFR, Est African American: >89 mL/min (ref >=60)
GFR, Est Non African American: >89 mL/min (ref >=60)
GLUCOSE: 104 mg/dL — AB (ref 65–99)
Potassium: 3.8 mmol/L (ref 3.5–5.3)
SODIUM: 131 mmol/L — AB (ref 135–146)
TOTAL PROTEIN: 7.5 g/dL (ref 6.1–8.1)

## 2015-12-07 LAB — LIPID PANEL
CHOLESTEROL: 178 mg/dL (ref 125–200)
HDL: 60 mg/dL (ref >=40)
LDL Cholesterol: 105 mg/dL (ref <130)
TRIGLYCERIDES: 63 mg/dL (ref <150)
Total CHOL/HDL Ratio: 3 Ratio (ref <=5.0)
VLDL: 13 mg/dL (ref <30)

## 2015-12-07 LAB — HEMOGLOBIN A1C
Hgb A1c MFr Bld: 5.7 % — ABNORMAL HIGH (ref <5.7)
MEAN PLASMA GLUCOSE: 117 mg/dL

## 2015-12-07 MED ORDER — LISINOPRIL 20 MG PO TABS: 20.0000 mg | ORAL_TABLET | Freq: Every day | ORAL | Status: DC

## 2015-12-07 MED ORDER — RANITIDINE HCL 150 MG PO TABS: 150.0000 mg | ORAL_TABLET | Freq: Two times a day (BID) | ORAL | Status: DC

## 2015-12-07 NOTE — Progress Notes (Signed)
Subjective:    Patient ID: Stephen Spears, male    DOB: 02/03/1966, 50 y.o.   MRN: [id number]  HPI This is a 50 yo male who presents for CPE. He has been doing well. He has lost a considerable amount of weight through diet and exercise. He lost 24 pounds in last 6 months. He is walking when stopped while driving. Energy level great, no aches or pains. Reflux symptoms greatly improved. Little stress.   Last CPE- several years, has regular visits Tdap- unknown, will update today Flu- annual Dental- not regular Eye-regular Exercise- regular  Past Medical History  Diagnosis Date  . Hypertension   . Cleft palate   . Obesity   . GERD (gastroesophageal reflux disease)    Past Surgical History  Procedure Laterality Date  . Cleft palate repair  2005   Family History  Problem Relation Age of Onset  . Hypertension Mother   . Thyroid disease Mother   . Alcohol abuse Father   . Alcohol abuse Sister   . Hypertension Sister   . Hypertension Sister   . Hypertension Sister   . Hypertension Sister   . Hypertension Sister   . Hypertension Sister    Social History  Substance Use Topics  . Smoking status: Former Games developer  . Smokeless tobacco: Never Used     Comment: quit in 2007  . Alcohol Use: 0.0 - 1.2 oz/week    0-2 Standard drinks or equivalent per week     Comment: Ocassional      Review of Systems  Constitutional: Negative.   HENT: Negative.   Eyes: Negative.   Respiratory: Negative.   Cardiovascular: Negative.   Gastrointestinal: Negative.   Endocrine: Negative.   Genitourinary: Negative.   Musculoskeletal: Negative.   Skin: Negative.   Neurological: Negative.   Hematological: Negative.   Psychiatric/Behavioral: Negative.        Objective:   Physical Exam Physical Exam  Constitutional: He is oriented to person, place, and time. He appears well-developed and well-nourished.  HENT:  Head: Normocephalic and atraumatic. 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  Right Ear: External ear normal.  Left Ear: External ear normal.  Nose: Nose normal.  Mouth/Throat: Oropharynx is clear and moist.  Eyes: Conjunctivae are normal.  Neck: Normal range of motion. Neck supple.  Cardiovascular: Normal rate, regular rhythm, normal heart sounds and intact distal pulses.   Pulmonary/Chest: Effort normal and breath sounds normal.  Abdominal: Soft. Bowel sounds are normal. Hernia confirmed negative in the right inguinal area and confirmed negative in the left inguinal area.  Genitourinary: Testes normal and penis normal. Circumcised.  Musculoskeletal: Normal range of motion. He exhibits no edema or tenderness.       Cervical back: Normal.       Thoracic back: Normal.       Lumbar back: Normal.  Lymphadenopathy:    He has no cervical adenopathy.       Right: No inguinal adenopathy present.       Left: No inguinal adenopathy present.  Neurological: He is alert and oriented to person, place, and time. He has normal reflexes.  Skin: Skin is warm and dry.  Psychiatric: He has a normal mood and affect. His behavior is normal. Judgment normal.  Vitals reviewed.  BP 97/64 mmHg  Pulse 84  Temp(Src) 98.7 F (37.1 C) (Oral)  Resp 16  Ht 6' (1.829 m)  Wt 224 lb (101.606 kg)  BMI  30.37 kg/m2 Wt Readings from Last 3 Encounters:  12/07/15 224 lb (101.606 kg)  06/04/15 258 lb 12.8 oz (117.391 kg)  12/01/14 252 lb 12.8 oz (114.669 kg)  Ideal body weight: 77.599 kg (171 lb 1.2 oz) Adjusted ideal body weight: 87.201 kg (192 lb 3.9 oz)      Assessment & Plan:  1. Annual physical exam -- Discussed and encouraged healthy lifestyle choices- adequate sleep, regular exercise, stress management and healthy food choices.    2. Essential hypertension - blood pressure a little low today. Patient asymptomatic. Will stop HCTZ.  - Lipid panel - COMPLETE METABOLIC PANEL WITH GFR - lisinopril (PRINIVIL,ZESTRIL) 20 MG  tablet; Take 1 tablet (20 mg total) by mouth daily.  Dispense: 90 tablet; Refill: 1  3. Obesity, unspecified - encouraged continued healthy food choices and regular exercise, discussed ideal body weight  - Lipid panel - Hemoglobin A1c - COMPLETE METABOLIC PANEL WITH GFR  4. Blood glucose elevated - Hemoglobin A1c - COMPLETE METABOLIC PANEL WITH GFR  5. Need for Tdap vaccination - Tdap vaccine greater than or equal to 7yo IM  6. Gastroesophageal reflux disease without esophagitis - will do a trial of once a day treatment and then decrease to prn if able to tolerate. Symptom improvement could be due to weight loss.  - ranitidine (ZANTAC) 150 MG tablet; Take 1 tablet (150 mg total) by mouth 2 (two) times daily.  Dispense: 180 tablet; Refill: 1   Olean Reeeborah Tonyetta Berko, FNP-BC  [facility]  12/07/2015 2:18 PM

## 2015-12-07 NOTE — Patient Instructions (Addendum)
Please stop taking your HCTZ (diuril) Check your blood pressure if you get an opportunity Goal is less than 140/90 Try to take your ranitidine only at bedtime. If no symptoms, can stop taking daily and take only as needed.   Call in October for a follow up in November  Dentists-  [facility]- [phone] [facility]- [phone]  Keeping you healthy  Get these tests  Blood pressure- Have your blood pressure checked once a year by your healthcare provider.  Normal blood pressure is 120/80.  Weight- Have your body mass index (BMI) calculated to screen for obesity.  BMI is a measure of body fat based on height and weight. You can also calculate your own BMI at [email or web address]-[email or web address]/.  Cholesterol- Have your cholesterol checked regularly starting at age 48, sooner may be necessary if you have diabetes, high blood pressure, if a family member developed heart diseases at an early age or if you smoke.   Chlamydia, HIV, and other sexual transmitted disease- Get screened each year until the age of 48 then within three months of each new sexual partner.  Diabetes- Have your blood sugar checked regularly if you have high blood pressure, high cholesterol, a family history of diabetes or if you are overweight.  Get these vaccines  Flu shot- Every fall.  Tetanus shot- Every 10 years.  Menactra- Single dose; prevents meningitis.  Take these steps  Don't smoke- If you do smoke, ask your healthcare provider about quitting. For tips on how to quit, go to www.smokefree.gov or call 1-800-QUIT-NOW.  Be physically active- Exercise 5 days a week for at least 30 minutes.  If you are not already physically active start slow and gradually work up to 30 minutes of moderate physical activity.  Examples of moderate activity include walking briskly, mowing the yard, dancing, swimming bicycling, etc.  Eat a healthy diet- Eat a variety of healthy foods such as fruits, vegetables, low fat  milk, low fat cheese, yogurt, lean meats, poultry, fish, beans, tofu, etc.  For more information on healthy eating, go to www.thenutritionsource.org  Drink alcohol in moderation- Limit alcohol intake two drinks or less a day.  Never drink and drive.  Dentist- Brush and floss teeth twice daily; visit your dentis twice a year.  Depression-Your emotional health is as important as your physical health.  If you're feeling down, losing interest in things you normally enjoy please talk with your healthcare provider.  Gun Safety- If you keep a gun in your home, keep it unloaded and with the safety lock on.  Bullets should be stored separately.  Helmet use- Always wear a helmet when riding a motorcycle, bicycle, rollerblading or skateboarding.  Safe sex- If you may be exposed to a sexually transmitted infection, use a condom  Seat belts- Seat bels can save your life; always wear one.  Smoke/Carbon Monoxide detectors- These detectors need to be installed on the appropriate level of your home.  Replace batteries at least once a year.  Skin Cancer- When out in the sun, cover up and use sunscreen SPF 15 or higher.  Violence- If anyone is threatening or hurting you, please tell your healthcare provider.   IF you received an x-ray today, you will receive an invoice from [facility]. Please contact [facility] at [phone] with questions or concerns regarding your invoice.   IF you received labwork today, you will receive an invoice from [facility]. Please contact [facility] at [phone] with questions or concerns regarding your invoice.  Our billing staff will not be able to assist you with questions regarding bills from these companies.  You will be contacted with the lab results as soon as they are available. The fastest way to get your results is to activate your My Chart account. Instructions are located on the last page of this paperwork. If you  have not heard from [place] regarding the results in 2 weeks, please contact this office.

## 2016-06-29 ENCOUNTER — Other Ambulatory Visit: Payer: Self-pay | Admitting: Family Medicine

## 2016-06-29 DIAGNOSIS — I10 Essential (primary) hypertension: Secondary | ICD-10-CM

## 2016-07-07 NOTE — Telephone Encounter (Signed)
Surescripts & fax for lisinopril 20mg   Sent #30 with note to establish with another PCP prior to needing refills

## 2017-06-01 ENCOUNTER — Emergency Department (HOSPITAL_COMMUNITY): Payer: Self-pay

## 2017-06-01 ENCOUNTER — Inpatient Hospital Stay (HOSPITAL_COMMUNITY): Payer: Self-pay | Admitting: Certified Registered Nurse Anesthetist

## 2017-06-01 ENCOUNTER — Inpatient Hospital Stay (HOSPITAL_COMMUNITY): Payer: Self-pay

## 2017-06-01 ENCOUNTER — Inpatient Hospital Stay (HOSPITAL_COMMUNITY)
Admission: EM | Admit: 2017-06-01 | Discharge: 2017-06-12 | DRG: 493 | Disposition: A | Discharge status: Skilled Nursing Facility | Payer: Self-pay | Attending: Otolaryngology | Admitting: Otolaryngology

## 2017-06-01 ENCOUNTER — Encounter (HOSPITAL_COMMUNITY): Payer: Self-pay | Admitting: Emergency Medicine

## 2017-06-01 ENCOUNTER — Other Ambulatory Visit: Payer: Self-pay

## 2017-06-01 ENCOUNTER — Encounter (HOSPITAL_COMMUNITY): Admission: EM | Disposition: A | Discharge status: Skilled Nursing Facility | Payer: Self-pay | Source: Home / Self Care

## 2017-06-01 DIAGNOSIS — S2239XA Fracture of one rib, unspecified side, initial encounter for closed fracture: Secondary | ICD-10-CM

## 2017-06-01 DIAGNOSIS — K219 Gastro-esophageal reflux disease without esophagitis: Secondary | ICD-10-CM | POA: Diagnosis present

## 2017-06-01 DIAGNOSIS — S72001A Fracture of unspecified part of neck of right femur, initial encounter for closed fracture: Secondary | ICD-10-CM

## 2017-06-01 DIAGNOSIS — S82301A Unspecified fracture of lower end of right tibia, initial encounter for closed fracture: Secondary | ICD-10-CM | POA: Diagnosis present

## 2017-06-01 DIAGNOSIS — S0240DA Maxillary fracture, left side, initial encounter for closed fracture: Secondary | ICD-10-CM

## 2017-06-01 DIAGNOSIS — D62 Acute posthemorrhagic anemia: Secondary | ICD-10-CM | POA: Diagnosis not present

## 2017-06-01 DIAGNOSIS — Y92410 Unspecified street and highway as the place of occurrence of the external cause: Secondary | ICD-10-CM

## 2017-06-01 DIAGNOSIS — R739 Hyperglycemia, unspecified: Secondary | ICD-10-CM | POA: Diagnosis present

## 2017-06-01 DIAGNOSIS — K5900 Constipation, unspecified: Secondary | ICD-10-CM | POA: Diagnosis not present

## 2017-06-01 DIAGNOSIS — Z87891 Personal history of nicotine dependence: Secondary | ICD-10-CM

## 2017-06-01 DIAGNOSIS — Z23 Encounter for immunization: Secondary | ICD-10-CM

## 2017-06-01 DIAGNOSIS — S02602A Fracture of unspecified part of body of left mandible, initial encounter for closed fracture: Secondary | ICD-10-CM | POA: Diagnosis present

## 2017-06-01 DIAGNOSIS — Z79899 Other long term (current) drug therapy: Secondary | ICD-10-CM

## 2017-06-01 DIAGNOSIS — I10 Essential (primary) hypertension: Secondary | ICD-10-CM | POA: Diagnosis present

## 2017-06-01 DIAGNOSIS — S82842A Displaced bimalleolar fracture of left lower leg, initial encounter for closed fracture: Secondary | ICD-10-CM

## 2017-06-01 DIAGNOSIS — Z419 Encounter for procedure for purposes other than remedying health state, unspecified: Secondary | ICD-10-CM

## 2017-06-01 DIAGNOSIS — S73034A Other anterior dislocation of right hip, initial encounter: Secondary | ICD-10-CM

## 2017-06-01 DIAGNOSIS — T1490XA Injury, unspecified, initial encounter: Secondary | ICD-10-CM

## 2017-06-01 DIAGNOSIS — S8261XA Displaced fracture of lateral malleolus of right fibula, initial encounter for closed fracture: Secondary | ICD-10-CM | POA: Diagnosis present

## 2017-06-01 DIAGNOSIS — S060X9A Concussion with loss of consciousness of unspecified duration, initial encounter: Secondary | ICD-10-CM | POA: Diagnosis present

## 2017-06-01 DIAGNOSIS — Z923 Personal history of irradiation: Secondary | ICD-10-CM

## 2017-06-01 DIAGNOSIS — S2249XA Multiple fractures of ribs, unspecified side, initial encounter for closed fracture: Secondary | ICD-10-CM

## 2017-06-01 DIAGNOSIS — S2241XA Multiple fractures of ribs, right side, initial encounter for closed fracture: Secondary | ICD-10-CM | POA: Diagnosis present

## 2017-06-01 DIAGNOSIS — S73014A Posterior dislocation of right hip, initial encounter: Secondary | ICD-10-CM | POA: Diagnosis present

## 2017-06-01 DIAGNOSIS — T148XXA Other injury of unspecified body region, initial encounter: Secondary | ICD-10-CM

## 2017-06-01 DIAGNOSIS — S82891A Other fracture of right lower leg, initial encounter for closed fracture: Secondary | ICD-10-CM

## 2017-06-01 DIAGNOSIS — S32421A Displaced fracture of posterior wall of right acetabulum, initial encounter for closed fracture: Principal | ICD-10-CM

## 2017-06-01 DIAGNOSIS — Z8249 Family history of ischemic heart disease and other diseases of the circulatory system: Secondary | ICD-10-CM

## 2017-06-01 DIAGNOSIS — S02602B Fracture of unspecified part of body of left mandible, initial encounter for open fracture: Secondary | ICD-10-CM | POA: Diagnosis present

## 2017-06-01 DIAGNOSIS — S300XXA Contusion of lower back and pelvis, initial encounter: Secondary | ICD-10-CM | POA: Diagnosis present

## 2017-06-01 HISTORY — PX: ANKLE CLOSED REDUCTION: SHX880

## 2017-06-01 HISTORY — PX: FACIAL LACERATION REPAIR: SHX6589

## 2017-06-01 HISTORY — PX: ORIF MANDIBULAR FRACTURE: SHX2127

## 2017-06-01 HISTORY — PX: ORIF ANKLE FRACTURE: SUR919

## 2017-06-01 HISTORY — PX: HIP CLOSED REDUCTION: SHX983

## 2017-06-01 LAB — I-STAT CHEM 8, ED
BUN: 19 mg/dL (ref 6–20)
CREATININE: 1.2 mg/dL (ref 0.61–1.24)
Calcium, Ion: 1.14 mmol/L — ABNORMAL LOW (ref 1.15–1.40)
Chloride: 105 mmol/L (ref 101–111)
Glucose, Bld: 200 mg/dL — ABNORMAL HIGH (ref 65–99)
HEMATOCRIT: 41 % (ref 39.0–52.0)
HEMOGLOBIN: 13.9 g/dL (ref 13.0–17.0)
POTASSIUM: 3.3 mmol/L — AB (ref 3.5–5.1)
SODIUM: 140 mmol/L (ref 135–145)
TCO2: 22 mmol/L (ref 22–32)

## 2017-06-01 LAB — CBC
HEMATOCRIT: 39 % (ref 39.0–52.0)
Hemoglobin: 13.4 g/dL (ref 13.0–17.0)
MCH: 30.5 pg (ref 26.0–34.0)
MCHC: 34.4 g/dL (ref 30.0–36.0)
MCV: 88.8 fL (ref 78.0–100.0)
Platelets: 207 10*3/uL (ref 150–400)
RBC: 4.39 MIL/uL (ref 4.22–5.81)
RDW: 12.8 % (ref 11.5–15.5)
WBC: 11.2 10*3/uL — AB (ref 4.0–10.5)

## 2017-06-01 LAB — SAMPLE TO BLOOD BANK

## 2017-06-01 LAB — COMPREHENSIVE METABOLIC PANEL
ALBUMIN: 3.7 g/dL (ref 3.5–5.0)
ALT: 73 U/L — AB (ref 17–63)
AST: 76 U/L — AB (ref 15–41)
Alkaline Phosphatase: 57 U/L (ref 38–126)
Anion gap: 9 (ref 5–15)
BUN: 16 mg/dL (ref 6–20)
CHLORIDE: 107 mmol/L (ref 101–111)
CO2: 22 mmol/L (ref 22–32)
CREATININE: 1.22 mg/dL (ref 0.61–1.24)
Calcium: 8.4 mg/dL — ABNORMAL LOW (ref 8.9–10.3)
GFR calc Af Amer: >60 mL/min (ref >60)
GLUCOSE: 203 mg/dL — AB (ref 65–99)
POTASSIUM: 3.4 mmol/L — AB (ref 3.5–5.1)
SODIUM: 138 mmol/L (ref 135–145)
Total Bilirubin: 1.3 mg/dL — ABNORMAL HIGH (ref 0.3–1.2)
Total Protein: 6.5 g/dL (ref 6.5–8.1)

## 2017-06-01 LAB — I-STAT CG4 LACTIC ACID, ED: LACTIC ACID, VENOUS: 2.11 mmol/L — AB (ref 0.5–1.9)

## 2017-06-01 LAB — PROTIME-INR
INR: 0.97
PROTHROMBIN TIME: 12.8 s (ref 11.4–15.2)

## 2017-06-01 LAB — ETHANOL: Alcohol, Ethyl (B): <10 mg/dL (ref <10)

## 2017-06-01 LAB — CDS SEROLOGY

## 2017-06-01 SURGERY — OPEN REDUCTION INTERNAL FIXATION (ORIF) MANDIBULAR FRACTURE
Anesthesia: General | Laterality: Right

## 2017-06-01 MED ORDER — HYDROMORPHONE HCL 1 MG/ML IJ SOLN
INTRAMUSCULAR | Status: AC | PRN
  Administered 2017-06-01 (×2): 1 mg via INTRAVENOUS

## 2017-06-01 MED ORDER — INFLUENZA VAC SPLIT QUAD 0.5 ML IM SUSY
0.5000 mL | PREFILLED_SYRINGE | INTRAMUSCULAR | Status: AC
  Administered 2017-06-02: 0.5 mL via INTRAMUSCULAR
  Filled 2017-06-01: qty 0.5

## 2017-06-01 MED ORDER — PHENYLEPHRINE 40 MCG/ML (10ML) SYRINGE FOR IV PUSH (FOR BLOOD PRESSURE SUPPORT)
PREFILLED_SYRINGE | INTRAVENOUS | Status: DC | PRN
  Administered 2017-06-01 (×6): 80 ug via INTRAVENOUS

## 2017-06-01 MED ORDER — POTASSIUM CHLORIDE IN NACL 20-0.9 MEQ/L-% IV SOLN
INTRAVENOUS | Status: DC
  Administered 2017-06-01 – 2017-06-05 (×7): via INTRAVENOUS
  Filled 2017-06-01 (×9): qty 1000

## 2017-06-01 MED ORDER — SODIUM CHLORIDE 0.9 % IV SOLN
INTRAVENOUS | Status: DC
  Administered 2017-06-04 – 2017-06-08 (×3): via INTRAVENOUS

## 2017-06-01 MED ORDER — ONDANSETRON HCL 4 MG/2ML IJ SOLN
INTRAMUSCULAR | Status: DC | PRN
  Administered 2017-06-01: 4 mg via INTRAVENOUS

## 2017-06-01 MED ORDER — DEXAMETHASONE SODIUM PHOSPHATE 10 MG/ML IJ SOLN
INTRAMUSCULAR | Status: AC
  Filled 2017-06-01: qty 1

## 2017-06-01 MED ORDER — SUGAMMADEX SODIUM 200 MG/2ML IV SOLN
INTRAVENOUS | Status: AC
  Filled 2017-06-01: qty 2

## 2017-06-01 MED ORDER — LIDOCAINE-EPINEPHRINE 1 %-1:100000 IJ SOLN: INTRAMUSCULAR | Status: DC | PRN

## 2017-06-01 MED ORDER — ONDANSETRON 4 MG PO TBDP
4.0000 mg | ORAL_TABLET | Freq: Four times a day (QID) | ORAL | Status: DC | PRN
  Filled 2017-06-01: qty 1

## 2017-06-01 MED ORDER — PHENYLEPHRINE 40 MCG/ML (10ML) SYRINGE FOR IV PUSH (FOR BLOOD PRESSURE SUPPORT)
PREFILLED_SYRINGE | INTRAVENOUS | Status: AC
  Filled 2017-06-01: qty 20

## 2017-06-01 MED ORDER — LACTATED RINGERS IV SOLN
INTRAVENOUS | Status: DC
  Administered 2017-06-01 (×2): via INTRAVENOUS

## 2017-06-01 MED ORDER — CLINDAMYCIN PHOSPHATE 900 MG/50ML IV SOLN
INTRAVENOUS | Status: DC | PRN
  Administered 2017-06-01: 900 mg via INTRAVENOUS

## 2017-06-01 MED ORDER — BACITRACIN ZINC 500 UNIT/GM EX OINT
TOPICAL_OINTMENT | CUTANEOUS | Status: AC
  Filled 2017-06-01: qty 28.35

## 2017-06-01 MED ORDER — POVIDONE-IODINE 10 % EX SWAB: 2.0000 "application " | Freq: Once | CUTANEOUS | Status: DC

## 2017-06-01 MED ORDER — PROPOFOL 10 MG/ML IV BOLUS
INTRAVENOUS | Status: DC | PRN
  Administered 2017-06-01: 140 mg via INTRAVENOUS
  Administered 2017-06-01: 60 mg via INTRAVENOUS

## 2017-06-01 MED ORDER — CHLORHEXIDINE GLUCONATE 4 % EX LIQD: 60.0000 mL | Freq: Once | CUTANEOUS | Status: DC

## 2017-06-01 MED ORDER — IOPAMIDOL (ISOVUE-370) INJECTION 76%
INTRAVENOUS | Status: AC
  Administered 2017-06-01: 100 mL
  Filled 2017-06-01: qty 100

## 2017-06-01 MED ORDER — FENTANYL CITRATE (PF) 100 MCG/2ML IJ SOLN
INTRAMUSCULAR | Status: DC | PRN
Start: 2017-06-01 — End: 2017-06-01
  Administered 2017-06-01: 100 ug via INTRAVENOUS
  Administered 2017-06-01 (×2): 50 ug via INTRAVENOUS
  Administered 2017-06-01: 150 ug via INTRAVENOUS
  Administered 2017-06-01 (×3): 50 ug via INTRAVENOUS

## 2017-06-01 MED ORDER — SODIUM CHLORIDE 0.9 % IV BOLUS (SEPSIS): 125.0000 mL | Freq: Once | INTRAVENOUS | Status: DC

## 2017-06-01 MED ORDER — SUCCINYLCHOLINE CHLORIDE 200 MG/10ML IV SOSY
PREFILLED_SYRINGE | INTRAVENOUS | Status: AC
  Filled 2017-06-01: qty 10

## 2017-06-01 MED ORDER — IOPAMIDOL (ISOVUE-300) INJECTION 61%
INTRAVENOUS | Status: AC
  Filled 2017-06-01: qty 100

## 2017-06-01 MED ORDER — LACTATED RINGERS IV SOLN
INTRAVENOUS | Status: DC | PRN
  Administered 2017-06-01 (×2): via INTRAVENOUS

## 2017-06-01 MED ORDER — METOPROLOL TARTRATE 5 MG/5ML IV SOLN
5.0000 mg | Freq: Four times a day (QID) | INTRAVENOUS | Status: DC | PRN
  Administered 2017-06-01: 5 mg via INTRAVENOUS
  Filled 2017-06-01: qty 5

## 2017-06-01 MED ORDER — 0.9 % SODIUM CHLORIDE (POUR BTL) OPTIME
TOPICAL | Status: DC | PRN
  Administered 2017-06-01: 1000 mL

## 2017-06-01 MED ORDER — PANTOPRAZOLE SODIUM 40 MG PO TBEC
40.0000 mg | DELAYED_RELEASE_TABLET | Freq: Every day | ORAL | Status: DC
  Administered 2017-06-02 – 2017-06-11 (×9): 40 mg via ORAL
  Filled 2017-06-01 (×10): qty 1

## 2017-06-01 MED ORDER — HYDROMORPHONE HCL 1 MG/ML IJ SOLN
1.0000 mg | INTRAMUSCULAR | Status: DC | PRN
  Administered 2017-06-01 – 2017-06-05 (×12): 1 mg via INTRAVENOUS
  Filled 2017-06-01 (×13): qty 1

## 2017-06-01 MED ORDER — SODIUM CHLORIDE 0.9 % IV SOLN
INTRAVENOUS | Status: AC | PRN
  Administered 2017-06-01: 125 mL/h via INTRAVENOUS
  Administered 2017-06-01: 1000 mL via INTRAVENOUS

## 2017-06-01 MED ORDER — TETANUS-DIPHTH-ACELL PERTUSSIS 5-2.5-18.5 LF-MCG/0.5 IM SUSP
0.5000 mL | Freq: Once | INTRAMUSCULAR | Status: AC
  Administered 2017-06-12: 0.5 mL via INTRAMUSCULAR
  Filled 2017-06-01: qty 0.5

## 2017-06-01 MED ORDER — ONDANSETRON HCL 4 MG/2ML IJ SOLN
INTRAMUSCULAR | Status: AC
  Filled 2017-06-01: qty 2

## 2017-06-01 MED ORDER — SUCCINYLCHOLINE CHLORIDE 200 MG/10ML IV SOSY
PREFILLED_SYRINGE | INTRAVENOUS | Status: DC | PRN
  Administered 2017-06-01: 100 mg via INTRAVENOUS

## 2017-06-01 MED ORDER — ROCURONIUM BROMIDE 10 MG/ML (PF) SYRINGE
PREFILLED_SYRINGE | INTRAVENOUS | Status: AC
  Filled 2017-06-01: qty 5

## 2017-06-01 MED ORDER — ONDANSETRON HCL 4 MG/2ML IJ SOLN: 4.0000 mg | Freq: Four times a day (QID) | INTRAMUSCULAR | Status: DC | PRN

## 2017-06-01 MED ORDER — HYDROMORPHONE HCL 1 MG/ML IJ SOLN
1.0000 mg | Freq: Once | INTRAMUSCULAR | Status: DC
Start: 2017-06-01 — End: 2017-06-04

## 2017-06-01 MED ORDER — HYDROMORPHONE HCL 1 MG/ML IJ SOLN
INTRAMUSCULAR | Status: AC
  Filled 2017-06-01: qty 1

## 2017-06-01 MED ORDER — SUGAMMADEX SODIUM 200 MG/2ML IV SOLN
INTRAVENOUS | Status: DC | PRN
  Administered 2017-06-01: 200 mg via INTRAVENOUS

## 2017-06-01 MED ORDER — LIDOCAINE-EPINEPHRINE 1 %-1:100000 IJ SOLN
INTRAMUSCULAR | Status: AC
  Filled 2017-06-01: qty 1

## 2017-06-01 MED ORDER — CEFAZOLIN SODIUM 1 G IJ SOLR
INTRAMUSCULAR | Status: AC | PRN
  Administered 2017-06-01: 2 mg via INTRAVENOUS

## 2017-06-01 MED ORDER — ROCURONIUM BROMIDE 50 MG/5ML IV SOSY
PREFILLED_SYRINGE | INTRAVENOUS | Status: DC | PRN
  Administered 2017-06-01: 60 mg via INTRAVENOUS
  Administered 2017-06-01 (×2): 20 mg via INTRAVENOUS

## 2017-06-01 MED ORDER — CEFAZOLIN SODIUM-DEXTROSE 2-4 GM/100ML-% IV SOLN
2.0000 g | Freq: Once | INTRAVENOUS | Status: DC
  Filled 2017-06-01: qty 100

## 2017-06-01 MED ORDER — CLINDAMYCIN PHOSPHATE 900 MG/50ML IV SOLN
INTRAVENOUS | Status: AC
  Filled 2017-06-01: qty 50

## 2017-06-01 MED ORDER — TETANUS-DIPHTH-ACELL PERTUSSIS 5-2.5-18.5 LF-MCG/0.5 IM SUSP
INTRAMUSCULAR | Status: AC
Start: 2017-06-01 — End: 2017-06-01
  Filled 2017-06-01: qty 0.5

## 2017-06-01 MED ORDER — OXYMETAZOLINE HCL 0.05 % NA SOLN
NASAL | Status: AC
  Filled 2017-06-01: qty 15

## 2017-06-01 MED ORDER — DEXAMETHASONE SODIUM PHOSPHATE 10 MG/ML IJ SOLN
INTRAMUSCULAR | Status: DC | PRN
  Administered 2017-06-01: 10 mg via INTRAVENOUS

## 2017-06-01 MED ORDER — PANTOPRAZOLE SODIUM 40 MG IV SOLR
40.0000 mg | Freq: Every day | INTRAVENOUS | Status: DC
  Administered 2017-06-01 – 2017-06-03 (×2): 40 mg via INTRAVENOUS
  Filled 2017-06-01 (×2): qty 40

## 2017-06-01 MED ORDER — SODIUM CHLORIDE 0.9 % IV BOLUS (SEPSIS): 1000.0000 mL | Freq: Once | INTRAVENOUS | Status: DC

## 2017-06-01 MED ORDER — HYDROMORPHONE HCL 1 MG/ML IJ SOLN
INTRAMUSCULAR | Status: AC | PRN
Start: 2017-06-01 — End: 2017-06-01
  Administered 2017-06-01: 1 mg via INTRAVENOUS

## 2017-06-01 MED ORDER — BACITRACIN ZINC 500 UNIT/GM EX OINT
TOPICAL_OINTMENT | CUTANEOUS | Status: DC | PRN
  Administered 2017-06-01: 1 via TOPICAL

## 2017-06-01 MED ORDER — MIDAZOLAM HCL 2 MG/2ML IJ SOLN
INTRAMUSCULAR | Status: AC
  Filled 2017-06-01: qty 2

## 2017-06-01 MED ORDER — FENTANYL CITRATE (PF) 250 MCG/5ML IJ SOLN
INTRAMUSCULAR | Status: AC
  Filled 2017-06-01: qty 5

## 2017-06-01 MED ORDER — CEFAZOLIN SODIUM-DEXTROSE 2-4 GM/100ML-% IV SOLN: 2.0000 g | INTRAVENOUS | Status: DC

## 2017-06-01 SURGICAL SUPPLY — 38 items
BANDAGE ACE 6X5 VEL STRL LF (GAUZE/BANDAGES/DRESSINGS) ×4 IMPLANT
BANDAGE ELASTIC 4 VELCRO ST LF (GAUZE/BANDAGES/DRESSINGS) ×4 IMPLANT
BIT DRILL RAINBOW 1.6X35 (BIT) ×4 IMPLANT
BIT DRILL TWIST 1.6X58MM (BIT) ×2 IMPLANT
CANISTER SUCT 3000ML PPV (MISCELLANEOUS) ×4 IMPLANT
CLEANER TIP ELECTROSURG 2X2 (MISCELLANEOUS) ×4 IMPLANT
COVER SURGICAL LIGHT HANDLE (MISCELLANEOUS) ×4 IMPLANT
DRAIN PENROSE 18X1/4 LTX STRL (WOUND CARE) ×4 IMPLANT
DRILL TWIST 1.6X58MM (BIT) ×4
ELECT COATED BLADE 2.86 ST (ELECTRODE) ×4 IMPLANT
ELECT REM PT RETURN 9FT ADLT (ELECTROSURGICAL) ×4
ELECTRODE REM PT RTRN 9FT ADLT (ELECTROSURGICAL) ×2 IMPLANT
GAUZE SPONGE 4X4 12PLY STRL (GAUZE/BANDAGES/DRESSINGS) ×8 IMPLANT
GAUZE SPONGE 4X4 16PLY XRAY LF (GAUZE/BANDAGES/DRESSINGS) ×8 IMPLANT
GLOVE ECLIPSE 7.5 STRL STRAW (GLOVE) ×4 IMPLANT
GOWN STRL REUS W/ TWL LRG LVL3 (GOWN DISPOSABLE) ×4 IMPLANT
GOWN STRL REUS W/TWL LRG LVL3 (GOWN DISPOSABLE) ×4
KIT BASIN OR (CUSTOM PROCEDURE TRAY) ×4 IMPLANT
KIT ROOM TURNOVER OR (KITS) ×4 IMPLANT
NS IRRIG 1000ML POUR BTL (IV SOLUTION) ×4 IMPLANT
PAD ARMBOARD 7.5X6 YLW CONV (MISCELLANEOUS) ×8 IMPLANT
PENCIL FOOT CONTROL (ELECTRODE) ×4 IMPLANT
PLATE MNDBLE FRACTURE 14H (Plate) ×4 IMPLANT
PLATE MNDBLE MINI 4H (Plate) ×4 IMPLANT
SCREW BONE CROSS PIN 2.0X10MM (Screw) ×12 IMPLANT
SCREW MNDBLE 2.0X4 BONE (Screw) ×12 IMPLANT
SCREW MNDBLE 2.0X6 BONE (Screw) ×8 IMPLANT
SCREW MNDBLE 2.0X8 BONE (Screw) ×4 IMPLANT
SCREW MNDBLE 2.3X6MM BONE (Screw) ×8 IMPLANT
SPLINT FIBERGLASS 4X30 (CAST SUPPLIES) ×12 IMPLANT
SUT CHROMIC 3 0 SH 27 (SUTURE) ×4 IMPLANT
SUT ETHILON 4 0 PS 2 18 (SUTURE) ×8 IMPLANT
SUT VIC AB 3-0 SH 27 (SUTURE) ×4
SUT VIC AB 3-0 SH 27X BRD (SUTURE) ×4 IMPLANT
TAPE CLOTH SURG 6X10 WHT LF (GAUZE/BANDAGES/DRESSINGS) ×4 IMPLANT
TOWEL OR 17X24 6PK STRL BLUE (TOWEL DISPOSABLE) ×4 IMPLANT
TRAY ENT MC OR (CUSTOM PROCEDURE TRAY) ×4 IMPLANT
WATER STERILE IRR 1000ML POUR (IV SOLUTION) ×4 IMPLANT

## 2017-06-01 NOTE — ED Notes (Signed)
Portable xray at bedside.
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
## 2017-06-01 NOTE — Anesthesia Procedure Notes (Signed)
Procedure Name: Intubation Date/Time: 06/01/2017 1:32 PM Performed by: Pearson Grippeobertson, Millard Bautch M, CRNA Pre-anesthesia Checklist: Patient identified, Emergency Drugs available, Suction available and Patient being monitored Patient Re-evaluated:Patient Re-evaluated prior to induction Oxygen Delivery Method: Circle system utilized Preoxygenation: Pre-oxygenation with 100% oxygen Induction Type: IV induction, Cricoid Pressure applied and Rapid sequence Laryngoscope Size: Glidescope and 4 Grade View: Grade I Tube type: Oral Number of attempts: 1 Airway Equipment and Method: Stylet and Video-laryngoscopy Placement Confirmation: ETT inserted through vocal cords under direct vision,  positive ETCO2 and breath sounds checked- equal and bilateral Secured at: 25 cm Tube secured with: Tape Dental Injury: Teeth and Oropharynx as per pre-operative assessment  Difficulty Due To: Difficulty was anticipated and Difficult Airway-  due to edematous airway

## 2017-06-01 NOTE — ED Notes (Signed)
Orthro and trauma PA at bedside to see pt
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
## 2017-06-01 NOTE — Progress Notes (Signed)
Orthopedic Tech Progress Note Patient Details:  Stephen Spears 11/15/1965 [id number]  Patient ID: Stephen Spears, male   DOB: 09/17/1965, 51 y.o.   MRN: [id number]   Stephen Spears, Stephen Spears 06/01/2017, 8:39 AM Made level 2 trauma visit

## 2017-06-01 NOTE — Progress Notes (Signed)
Care of pt assumed by MA Tishara Pizano RN 
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
## 2017-06-01 NOTE — Anesthesia Preprocedure Evaluation (Addendum)
Anesthesia Evaluation  Patient identified by MRN, date of birth, ID band  Reviewed: Unable to perform ROS - Chart review onlyPreop documentation limited or incomplete due to emergent nature of procedure.  History of Anesthesia Complications Negative for: history of anesthetic complications  Airway Mallampati: IV  TM Distance: >3 FB Neck ROM: Limited  Mouth opening: Limited Mouth Opening  Dental  (+) Dental Advisory Given   Pulmonary former smoker,    breath sounds clear to auscultation       Cardiovascular hypertension,  Rhythm:Regular     Neuro/Psych    GI/Hepatic GERD  ,  Endo/Other    Renal/GU      Musculoskeletal   Abdominal   Peds  Hematology   Anesthesia Other Findings   Reproductive/Obstetrics                                                             Anesthesia Evaluation  Patient identified by MRN, date of birth, ID band Patient awake    Reviewed: Allergy & Precautions, NPO status , Patient's Chart, lab work & pertinent test results  Airway Mallampati: IV  TM Distance: >3 FB Neck ROM: Full  Mouth opening: Limited Mouth Opening  Dental  (+) Dental Advisory Given   Pulmonary former smoker,    Pulmonary exam normal breath sounds clear to auscultation       Cardiovascular hypertension, (-) Past MI Normal cardiovascular exam Rhythm:Regular Rate:Normal     Neuro/Psych negative neurological ROS  negative psych ROS   GI/Hepatic Neg liver ROS, GERD  Controlled,  Endo/Other  neg diabetesObesity  Renal/GU negative Renal ROS  negative genitourinary   Musculoskeletal negative musculoskeletal ROS (+)   Abdominal   Peds  Hematology  (+) anemia , Mild thrombocytopenia   Anesthesia Other Findings Hx cleft palate s/p repair, s/p ORIF mandible  Reproductive/Obstetrics                           Anesthesia Physical  Anesthesia  Plan  ASA: II  Anesthesia Plan: General   Post-op Pain Management:    Induction: Intravenous  PONV Risk Score and Plan: 3 and Treatment may vary due to age or medical condition, Midazolam, Dexamethasone and Ondansetron  Airway Management Planned: Oral ETT and Video Laryngoscope Planned  Additional Equipment: None  Intra-op Plan:   Post-operative Plan: Extubation in OR  Informed Consent: I have reviewed the patients History and Physical, chart, labs and discussed the procedure including the risks, benefits and alternatives for the proposed anesthesia with the patient or authorized representative who has indicated his/her understanding and acceptance.   Dental advisory given  Plan Discussed with: CRNA  Anesthesia Plan Comments: (Will utilize a BP cuff on each arm and alternate use periodically throughout case to minimize risk of nerve injury)       Anesthesia Quick Evaluation  Anesthesia Physical Anesthesia Plan  ASA: II  Anesthesia Plan: General   Post-op Pain Management:    Induction: Intravenous  PONV Risk Score and Plan: 2 and Treatment may vary due to age or medical condition  Airway Management Planned: Video Laryngoscope Planned  Additional Equipment: None  Intra-op Plan:   Post-operative Plan: Possible Post-op intubation/ventilation  Informed Consent:   Only emergency history available and History available from  chart only  Plan Discussed with: CRNA and Surgeon  Anesthesia Plan Comments:         Anesthesia Quick Evaluation

## 2017-06-01 NOTE — Transfer of Care (Signed)
Immediate Anesthesia Transfer of Care Note  Patient: Stephen Spears  Procedure(s) Performed: OPEN REDUCTION INTERNAL FIXATION (ORIF) MANDIBULAR FRACTURE (N/A ) FACIAL LACERATION REPAIR (N/A ) CLOSED REDUCTION HIP (Right ) CLOSED REDUCTION ANKLE (Right )  Patient Location: PACU  Anesthesia Type:General  Level of Consciousness: awake, alert  and oriented  Airway & Oxygen Therapy: Patient Spontanous Breathing and Patient connected to face mask oxygen  Post-op Assessment: Report given to RN and Post -op Vital signs reviewed and stable  Post vital signs: Reviewed and stable  Last Vitals:  Vitals:   06/01/17 1030 06/01/17 1100  BP: (!) 166/113 (!) 170/115  Pulse: 78 90  Resp: 18 14  SpO2: 99% 98%    Last Pain:  Vitals:   06/01/17 0823  PainSc: 8          Complications: No apparent anesthesia complications
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
## 2017-06-02 LAB — COMPREHENSIVE METABOLIC PANEL
ALT: 93 U/L — AB (ref 17–63)
AST: 187 U/L — ABNORMAL HIGH (ref 15–41)
Albumin: 2.9 g/dL — ABNORMAL LOW (ref 3.5–5.0)
Alkaline Phosphatase: 39 U/L (ref 38–126)
Anion gap: 6 (ref 5–15)
BILIRUBIN TOTAL: 1.2 mg/dL (ref 0.3–1.2)
BUN: 18 mg/dL (ref 6–20)
CHLORIDE: 108 mmol/L (ref 101–111)
CO2: 25 mmol/L (ref 22–32)
CREATININE: 1.17 mg/dL (ref 0.61–1.24)
Calcium: 7.9 mg/dL — ABNORMAL LOW (ref 8.9–10.3)
Glucose, Bld: 155 mg/dL — ABNORMAL HIGH (ref 65–99)
POTASSIUM: 4.2 mmol/L (ref 3.5–5.1)
Sodium: 139 mmol/L (ref 135–145)
TOTAL PROTEIN: 5.1 g/dL — AB (ref 6.5–8.1)

## 2017-06-02 LAB — CBC
HCT: 29 % — ABNORMAL LOW (ref 39.0–52.0)
Hemoglobin: 9.8 g/dL — ABNORMAL LOW (ref 13.0–17.0)
MCH: 30.2 pg (ref 26.0–34.0)
MCHC: 33.8 g/dL (ref 30.0–36.0)
MCV: 89.2 fL (ref 78.0–100.0)
PLATELETS: 172 10*3/uL (ref 150–400)
RBC: 3.25 MIL/uL — AB (ref 4.22–5.81)
RDW: 13 % (ref 11.5–15.5)
WBC: 8.7 10*3/uL (ref 4.0–10.5)

## 2017-06-02 LAB — HIV ANTIBODY (ROUTINE TESTING W REFLEX): HIV Screen 4th Generation wRfx: NONREACTIVE
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
## 2017-06-02 NOTE — Progress Notes (Signed)
Patient ID: Stephen Spears, male   DOB: 10/14/1965, 51 y.o.   MRN: [id number] Postop day 1, doing well.  He is able to talk and has no trouble breathing.  He has been drinking liquids.  His major complaint is pain in the leg.  On exam, the dressing was removed from the neck and the incision is intact and clean.  He has a lot of swelling along the left and anterior part of the face and mandible.  The oral closure is intact and the mandible feels solid.  The drain is in place.  A new dressing was applied.  I will remove the drain tomorrow.  We will continue liquid diet for a couple of days and then switch to soft diet for 6 weeks.

## 2017-06-02 NOTE — Progress Notes (Signed)
RN reinforced dressing.

## 2017-06-03 ENCOUNTER — Inpatient Hospital Stay (HOSPITAL_COMMUNITY): Payer: Self-pay

## 2017-06-03 DIAGNOSIS — S32421A Displaced fracture of posterior wall of right acetabulum, initial encounter for closed fracture: Secondary | ICD-10-CM

## 2017-06-03 DIAGNOSIS — S82891A Other fracture of right lower leg, initial encounter for closed fracture: Secondary | ICD-10-CM

## 2017-06-03 DIAGNOSIS — S72001A Fracture of unspecified part of neck of right femur, initial encounter for closed fracture: Secondary | ICD-10-CM

## 2017-06-03 LAB — URINALYSIS, ROUTINE W REFLEX MICROSCOPIC
BILIRUBIN URINE: NEGATIVE
GLUCOSE, UA: NEGATIVE mg/dL
Ketones, ur: NEGATIVE mg/dL
LEUKOCYTES UA: NEGATIVE
NITRITE: NEGATIVE
Protein, ur: NEGATIVE mg/dL
SPECIFIC GRAVITY, URINE: 1.009 (ref 1.005–1.030)
Squamous Epithelial / LPF: NONE SEEN
pH: 6 (ref 5.0–8.0)

## 2017-06-03 LAB — CBC
HCT: 26.1 % — ABNORMAL LOW (ref 39.0–52.0)
Hemoglobin: 8.7 g/dL — ABNORMAL LOW (ref 13.0–17.0)
MCH: 29.9 pg (ref 26.0–34.0)
MCHC: 33.3 g/dL (ref 30.0–36.0)
MCV: 89.7 fL (ref 78.0–100.0)
PLATELETS: 133 10*3/uL — AB (ref 150–400)
RBC: 2.91 MIL/uL — AB (ref 4.22–5.81)
RDW: 13.1 % (ref 11.5–15.5)
WBC: 7.5 10*3/uL (ref 4.0–10.5)

## 2017-06-03 LAB — MRSA PCR SCREENING: MRSA by PCR: NEGATIVE

## 2017-06-03 MED ORDER — ENOXAPARIN SODIUM 40 MG/0.4ML ~~LOC~~ SOLN
40.0000 mg | SUBCUTANEOUS | Status: DC
  Administered 2017-06-03 – 2017-06-12 (×9): 40 mg via SUBCUTANEOUS
  Filled 2017-06-03 (×9): qty 0.4

## 2017-06-03 MED ORDER — CEFAZOLIN SODIUM-DEXTROSE 2-4 GM/100ML-% IV SOLN
2.0000 g | INTRAVENOUS | Status: AC
  Administered 2017-06-04 (×2): 2 g via INTRAVENOUS
  Filled 2017-06-03 (×2): qty 100
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
## 2017-06-03 NOTE — Progress Notes (Signed)
Patient ID: Stephen Spears, male   DOB: 09/07/1965, 51 y.o.   MRN: [id number] Doing well overall, awake and alert.  Facial swelling continues, no worse.  The drain was removed.  Some serosanguineous material on the dressing.  New dressing applied.  Start back on liquid diet.  Stay on liquids and soft food for 6 weeks.  Sutures out in 1 week.

## 2017-06-04 ENCOUNTER — Inpatient Hospital Stay (HOSPITAL_COMMUNITY): Payer: Self-pay

## 2017-06-04 ENCOUNTER — Telehealth: Payer: Self-pay | Admitting: *Deleted

## 2017-06-04 ENCOUNTER — Encounter (HOSPITAL_COMMUNITY): Admission: EM | Disposition: A | Discharge status: Skilled Nursing Facility | Payer: Self-pay | Source: Home / Self Care

## 2017-06-04 ENCOUNTER — Inpatient Hospital Stay (HOSPITAL_COMMUNITY): Payer: Self-pay | Admitting: Certified Registered Nurse Anesthetist

## 2017-06-04 ENCOUNTER — Encounter (HOSPITAL_COMMUNITY): Payer: Self-pay | Admitting: Orthopedic Surgery

## 2017-06-04 HISTORY — PX: ORIF ANKLE FRACTURE: SHX5408

## 2017-06-04 HISTORY — PX: ORIF ACETABULAR FRACTURE: SHX5029

## 2017-06-04 LAB — CBC
HCT: 28.4 % — ABNORMAL LOW (ref 39.0–52.0)
HEMATOCRIT: 25.6 % — AB (ref 39.0–52.0)
HEMOGLOBIN: 8.7 g/dL — AB (ref 13.0–17.0)
Hemoglobin: 9.7 g/dL — ABNORMAL LOW (ref 13.0–17.0)
MCH: 30.4 pg (ref 26.0–34.0)
MCH: 30.3 pg (ref 26.0–34.0)
MCHC: 34 g/dL (ref 30.0–36.0)
MCHC: 34.2 g/dL (ref 30.0–36.0)
MCV: 89.5 fL (ref 78.0–100.0)
MCV: 88.8 fL (ref 78.0–100.0)
Platelets: 133 10*3/uL — ABNORMAL LOW (ref 150–400)
Platelets: 165 10*3/uL (ref 150–400)
RBC: 2.86 MIL/uL — ABNORMAL LOW (ref 4.22–5.81)
RBC: 3.2 MIL/uL — ABNORMAL LOW (ref 4.22–5.81)
RDW: 12.7 % (ref 11.5–15.5)
RDW: 12.7 % (ref 11.5–15.5)
WBC: 6.2 10*3/uL (ref 4.0–10.5)
WBC: 6.8 10*3/uL (ref 4.0–10.5)

## 2017-06-04 LAB — BASIC METABOLIC PANEL
Anion gap: 5 (ref 5–15)
BUN: 9 mg/dL (ref 6–20)
CHLORIDE: 107 mmol/L (ref 101–111)
CO2: 27 mmol/L (ref 22–32)
CREATININE: 1 mg/dL (ref 0.61–1.24)
Calcium: 8.4 mg/dL — ABNORMAL LOW (ref 8.9–10.3)
GFR calc Af Amer: >60 mL/min (ref >60)
GFR calc non Af Amer: >60 mL/min (ref >60)
Glucose, Bld: 112 mg/dL — ABNORMAL HIGH (ref 65–99)
Potassium: 4.4 mmol/L (ref 3.5–5.1)
Sodium: 139 mmol/L (ref 135–145)

## 2017-06-04 LAB — ABO/RH: ABO/RH(D): O POS

## 2017-06-04 LAB — HEMOGLOBIN A1C
Hgb A1c MFr Bld: 5 % (ref 4.8–5.6)
Mean Plasma Glucose: 96.8 mg/dL

## 2017-06-04 LAB — PREPARE RBC (CROSSMATCH)

## 2017-06-04 SURGERY — OPEN REDUCTION INTERNAL FIXATION (ORIF) ACETABULAR FRACTURE
Anesthesia: General | Laterality: Right

## 2017-06-04 MED ORDER — ONDANSETRON HCL 4 MG/2ML IJ SOLN
INTRAMUSCULAR | Status: DC | PRN
  Administered 2017-06-04: 4 mg via INTRAVENOUS

## 2017-06-04 MED ORDER — ROCURONIUM BROMIDE 10 MG/ML (PF) SYRINGE
PREFILLED_SYRINGE | INTRAVENOUS | Status: AC
  Filled 2017-06-04: qty 5

## 2017-06-04 MED ORDER — MIDAZOLAM HCL 2 MG/2ML IJ SOLN
INTRAMUSCULAR | Status: AC
  Filled 2017-06-04: qty 2

## 2017-06-04 MED ORDER — TOBRAMYCIN SULFATE 1.2 G IJ SOLR
INTRAMUSCULAR | Status: AC
  Filled 2017-06-04: qty 1.2

## 2017-06-04 MED ORDER — LIDOCAINE HCL (CARDIAC) 20 MG/ML IV SOLN
INTRAVENOUS | Status: DC | PRN
  Administered 2017-06-04: 80 mg via INTRAVENOUS

## 2017-06-04 MED ORDER — DEXAMETHASONE SODIUM PHOSPHATE 10 MG/ML IJ SOLN
INTRAMUSCULAR | Status: AC
  Filled 2017-06-04: qty 2

## 2017-06-04 MED ORDER — MIDAZOLAM HCL 5 MG/5ML IJ SOLN
INTRAMUSCULAR | Status: DC | PRN
  Administered 2017-06-04: 2 mg via INTRAVENOUS

## 2017-06-04 MED ORDER — FENTANYL CITRATE (PF) 100 MCG/2ML IJ SOLN
INTRAMUSCULAR | Status: AC
  Filled 2017-06-04: qty 2

## 2017-06-04 MED ORDER — TOBRAMYCIN SULFATE 1.2 G IJ SOLR
INTRAMUSCULAR | Status: DC | PRN
  Administered 2017-06-04: 1.2 g

## 2017-06-04 MED ORDER — SODIUM CHLORIDE 0.9 % IV SOLN: Freq: Once | INTRAVENOUS | Status: DC

## 2017-06-04 MED ORDER — CEFAZOLIN SODIUM-DEXTROSE 2-4 GM/100ML-% IV SOLN
2.0000 g | Freq: Three times a day (TID) | INTRAVENOUS | Status: AC
  Administered 2017-06-04 – 2017-06-05 (×3): 2 g via INTRAVENOUS
  Filled 2017-06-04 (×3): qty 100

## 2017-06-04 MED ORDER — LACTATED RINGERS IV SOLN
INTRAVENOUS | Status: DC | PRN
  Administered 2017-06-04 (×2): via INTRAVENOUS

## 2017-06-04 MED ORDER — FENTANYL CITRATE (PF) 100 MCG/2ML IJ SOLN
25.0000 ug | INTRAMUSCULAR | Status: DC | PRN
  Administered 2017-06-04: 25 ug via INTRAVENOUS
  Administered 2017-06-04: 50 ug via INTRAVENOUS
  Administered 2017-06-04 (×3): 25 ug via INTRAVENOUS

## 2017-06-04 MED ORDER — ROCURONIUM BROMIDE 100 MG/10ML IV SOLN
INTRAVENOUS | Status: DC | PRN
  Administered 2017-06-04: 30 mg via INTRAVENOUS
  Administered 2017-06-04 (×2): 50 mg via INTRAVENOUS
  Administered 2017-06-04 (×2): 30 mg via INTRAVENOUS
  Administered 2017-06-04: 10 mg via INTRAVENOUS

## 2017-06-04 MED ORDER — VANCOMYCIN HCL 1000 MG IV SOLR
INTRAVENOUS | Status: AC
  Filled 2017-06-04: qty 1000

## 2017-06-04 MED ORDER — PROPOFOL 10 MG/ML IV BOLUS
INTRAVENOUS | Status: DC | PRN
  Administered 2017-06-04: 200 mg via INTRAVENOUS

## 2017-06-04 MED ORDER — PROMETHAZINE HCL 25 MG/ML IJ SOLN: 6.2500 mg | INTRAMUSCULAR | Status: DC | PRN

## 2017-06-04 MED ORDER — SUGAMMADEX SODIUM 200 MG/2ML IV SOLN
INTRAVENOUS | Status: DC | PRN
  Administered 2017-06-04: 203.2 mg via INTRAVENOUS

## 2017-06-04 MED ORDER — SUCCINYLCHOLINE CHLORIDE 20 MG/ML IJ SOLN
INTRAMUSCULAR | Status: DC | PRN
  Administered 2017-06-04: 120 mg via INTRAVENOUS
  Administered 2017-06-04: 50 mg via INTRAVENOUS

## 2017-06-04 MED ORDER — ONDANSETRON HCL 4 MG/2ML IJ SOLN
INTRAMUSCULAR | Status: AC
  Filled 2017-06-04: qty 4

## 2017-06-04 MED ORDER — BACITRACIN ZINC 500 UNIT/GM EX OINT
TOPICAL_OINTMENT | CUTANEOUS | Status: AC
  Filled 2017-06-04: qty 28.35

## 2017-06-04 MED ORDER — VANCOMYCIN HCL 1000 MG IV SOLR
INTRAVENOUS | Status: DC | PRN
  Administered 2017-06-04: 1000 mg

## 2017-06-04 MED ORDER — PROPOFOL 10 MG/ML IV BOLUS
INTRAVENOUS | Status: AC
  Filled 2017-06-04: qty 20

## 2017-06-04 MED ORDER — FENTANYL CITRATE (PF) 100 MCG/2ML IJ SOLN
INTRAMUSCULAR | Status: DC | PRN
  Administered 2017-06-04: 50 ug via INTRAVENOUS
  Administered 2017-06-04 (×2): 100 ug via INTRAVENOUS
  Administered 2017-06-04: 50 ug via INTRAVENOUS

## 2017-06-04 MED ORDER — SUCCINYLCHOLINE CHLORIDE 200 MG/10ML IV SOSY
PREFILLED_SYRINGE | INTRAVENOUS | Status: AC
  Filled 2017-06-04: qty 10

## 2017-06-04 MED ORDER — FENTANYL CITRATE (PF) 250 MCG/5ML IJ SOLN
INTRAMUSCULAR | Status: AC
  Filled 2017-06-04: qty 5

## 2017-06-04 MED ORDER — LACTATED RINGERS IV SOLN
Freq: Once | INTRAVENOUS | Status: AC
  Administered 2017-06-04: 10:00:00 via INTRAVENOUS

## 2017-06-04 MED ORDER — PHENYLEPHRINE HCL 10 MG/ML IJ SOLN
INTRAVENOUS | Status: DC | PRN
  Administered 2017-06-04: 15 ug/min via INTRAVENOUS

## 2017-06-04 MED ORDER — KETOROLAC TROMETHAMINE 30 MG/ML IJ SOLN
INTRAMUSCULAR | Status: AC
  Filled 2017-06-04: qty 1

## 2017-06-04 MED ORDER — DEXAMETHASONE SODIUM PHOSPHATE 4 MG/ML IJ SOLN
INTRAMUSCULAR | Status: DC | PRN
  Administered 2017-06-04: 10 mg via INTRAVENOUS

## 2017-06-04 MED ORDER — SODIUM CHLORIDE 0.9 % IR SOLN
Status: DC | PRN
  Administered 2017-06-04: 3000 mL

## 2017-06-04 MED ORDER — BACITRACIN ZINC 500 UNIT/GM EX OINT
TOPICAL_OINTMENT | CUTANEOUS | Status: DC | PRN
  Administered 2017-06-04: 1 via TOPICAL

## 2017-06-04 SURGICAL SUPPLY — 106 items
BANDAGE ACE 4X5 VEL STRL LF (GAUZE/BANDAGES/DRESSINGS) ×3 IMPLANT
BANDAGE ACE 6X5 VEL STRL LF (GAUZE/BANDAGES/DRESSINGS) ×3 IMPLANT
BANDAGE ESMARK 6X9 LF (GAUZE/BANDAGES/DRESSINGS) ×1 IMPLANT
BIT DRILL 2.5 X LONG (BIT) ×1
BIT DRILL LCP QC 2X140 (BIT) ×3 IMPLANT
BIT DRILL STEP 3.5 (DRILL) IMPLANT
BIT DRILL X LONG 2.5 (BIT) ×1 IMPLANT
BLADE CLIPPER SURG (BLADE) IMPLANT
BLADE SURG 10 STRL SS (BLADE) ×3 IMPLANT
BLADE SURG 15 STRL LF DISP TIS (BLADE) ×1 IMPLANT
BLADE SURG 15 STRL SS (BLADE) ×2
BNDG ESMARK 6X9 LF (GAUZE/BANDAGES/DRESSINGS) ×3
BNDG GAUZE ELAST 4 BULKY (GAUZE/BANDAGES/DRESSINGS) ×6 IMPLANT
BRUSH SCRUB SURG 4.25 DISP (MISCELLANEOUS) ×6 IMPLANT
CHLORAPREP W/TINT 26ML (MISCELLANEOUS) ×9 IMPLANT
CLOSURE WOUND 1/2 X4 (GAUZE/BANDAGES/DRESSINGS)
COVER MAYO STAND STRL (DRAPES) IMPLANT
COVER SURGICAL LIGHT HANDLE (MISCELLANEOUS) ×3 IMPLANT
DRAPE C-ARM 42X72 X-RAY (DRAPES) ×3 IMPLANT
DRAPE C-ARMOR (DRAPES) ×3 IMPLANT
DRAPE HALF SHEET 40X57 (DRAPES) ×3 IMPLANT
DRAPE INCISE IOBAN 66X45 STRL (DRAPES) ×6 IMPLANT
DRAPE INCISE IOBAN 85X60 (DRAPES) IMPLANT
DRAPE ORTHO SPLIT 77X108 STRL (DRAPES) ×4
DRAPE SURG ORHT 6 SPLT 77X108 (DRAPES) ×2 IMPLANT
DRAPE U-SHAPE 47X51 STRL (DRAPES) ×3 IMPLANT
DRILL BIT X LONG 2.5 (BIT) ×2
DRILL STEP 3.5 (DRILL)
DRSG ADAPTIC 3X8 NADH LF (GAUZE/BANDAGES/DRESSINGS) ×6 IMPLANT
DRSG EMULSION OIL 3X3 NADH (GAUZE/BANDAGES/DRESSINGS) IMPLANT
DRSG MEPILEX BORDER 4X12 (GAUZE/BANDAGES/DRESSINGS) IMPLANT
DRSG MEPILEX BORDER 4X8 (GAUZE/BANDAGES/DRESSINGS) IMPLANT
ELECT BLADE 6.5 EXT (BLADE) IMPLANT
ELECT REM PT RETURN 9FT ADLT (ELECTROSURGICAL) ×3
ELECTRODE REM PT RTRN 9FT ADLT (ELECTROSURGICAL) ×1 IMPLANT
GAUZE SPONGE 4X4 12PLY STRL (GAUZE/BANDAGES/DRESSINGS) ×3 IMPLANT
GAUZE SPONGE 4X4 12PLY STRL LF (GAUZE/BANDAGES/DRESSINGS) ×6 IMPLANT
GLOVE BIO SURGEON STRL SZ7.5 (GLOVE) ×12 IMPLANT
GLOVE BIOGEL PI IND STRL 7.5 (GLOVE) ×1 IMPLANT
GLOVE BIOGEL PI INDICATOR 7.5 (GLOVE) ×2
GOWN STRL REUS W/ TWL LRG LVL3 (GOWN DISPOSABLE) ×2 IMPLANT
GOWN STRL REUS W/TWL LRG LVL3 (GOWN DISPOSABLE) ×4
HANDPIECE INTERPULSE COAX TIP (DISPOSABLE) ×2
KIT BASIN OR (CUSTOM PROCEDURE TRAY) ×3 IMPLANT
KIT ROOM TURNOVER OR (KITS) ×3 IMPLANT
MANIFOLD NEPTUNE II (INSTRUMENTS) ×3 IMPLANT
NEEDLE HYPO 21X1.5 SAFETY (NEEDLE) IMPLANT
NS IRRIG 1000ML POUR BTL (IV SOLUTION) ×3 IMPLANT
PACK TOTAL JOINT (CUSTOM PROCEDURE TRAY) ×3 IMPLANT
PAD ABD 8X10 STRL (GAUZE/BANDAGES/DRESSINGS) ×6 IMPLANT
PAD ARMBOARD 7.5X6 YLW CONV (MISCELLANEOUS) ×6 IMPLANT
PAD CAST 4YDX4 CTTN HI CHSV (CAST SUPPLIES) IMPLANT
PADDING CAST COTTON 4X4 STRL (CAST SUPPLIES)
PADDING CAST COTTON 6X4 STRL (CAST SUPPLIES) ×3 IMPLANT
PLATE LAT DIST FIB 2.7 6H RGHT (Plate) ×1 IMPLANT
PLATE PELVIC 8H 3.5 (Plate) ×3 IMPLANT
PLTE LAT DIST FIB 2.7 6H RIGHT (Plate) ×3 IMPLANT
RETRIEVER SUT HEWSON (MISCELLANEOUS) ×3 IMPLANT
SCREW CORTEX 3.5 14MM (Screw) ×2 IMPLANT
SCREW CORTEX 3.5 16MM (Screw) ×2 IMPLANT
SCREW CORTEX 3.5 18MM (Screw) ×2 IMPLANT
SCREW CORTEX 3.5 36MM (Screw) ×2 IMPLANT
SCREW CORTEX 3.5 38MM (Screw) ×2 IMPLANT
SCREW CORTEX 3.5 40MM (Screw) ×2 IMPLANT
SCREW CORTEX 3.5 50MM (Screw) ×3 IMPLANT
SCREW CORTEX 3.5 60MM (Screw) ×3 IMPLANT
SCREW LOCK CORT ST 3.5X14 (Screw) ×1 IMPLANT
SCREW LOCK CORT ST 3.5X16 (Screw) ×1 IMPLANT
SCREW LOCK CORT ST 3.5X18 (Screw) ×1 IMPLANT
SCREW LOCK CORT ST 3.5X36 (Screw) ×1 IMPLANT
SCREW LOCK CORT ST 3.5X38 (Screw) ×1 IMPLANT
SCREW LOCK CORT ST 3.5X40 (Screw) ×1 IMPLANT
SCREW LOCK VA ST 2.7X14 (Screw) ×3 IMPLANT
SCREW LOCK VA ST 2.7X18 (Screw) ×3 IMPLANT
SCREW LOCKING 2.7X16MM VA (Screw) ×3 IMPLANT
SCREW SCHNZ SD 5.0 60 THRD/150 (Screw) ×1 IMPLANT
SCRW SCHANZ SD 5.0 60 THRD/150 (Screw) ×3 IMPLANT
SET HNDPC FAN SPRY TIP SCT (DISPOSABLE) ×1 IMPLANT
SPLINT FIBERGLASS 4X30 (CAST SUPPLIES) ×3 IMPLANT
SPONGE LAP 18X18 X RAY DECT (DISPOSABLE) ×3 IMPLANT
STAPLER VISISTAT 35W (STAPLE) ×3 IMPLANT
STRIP CLOSURE SKIN 1/2X4 (GAUZE/BANDAGES/DRESSINGS) IMPLANT
SUCTION FRAZIER HANDLE 10FR (MISCELLANEOUS) ×2
SUCTION TUBE FRAZIER 10FR DISP (MISCELLANEOUS) ×1 IMPLANT
SUT ETHILON 2 0 PSLX (SUTURE) ×9 IMPLANT
SUT ETHILON 3 0 PS 1 (SUTURE) IMPLANT
SUT FIBERWIRE #2 38 T-5 BLUE (SUTURE)
SUT MNCRL AB 3-0 PS2 18 (SUTURE) IMPLANT
SUT MON AB 2-0 CT1 36 (SUTURE) IMPLANT
SUT PDS AB 2-0 CT1 27 (SUTURE) IMPLANT
SUT PROLENE 0 CT (SUTURE) IMPLANT
SUT VIC AB 0 CT1 18XCR BRD 8 (SUTURE) ×1 IMPLANT
SUT VIC AB 0 CT1 27 (SUTURE) ×6
SUT VIC AB 0 CT1 27XBRD ANBCTR (SUTURE) ×3 IMPLANT
SUT VIC AB 0 CT1 8-18 (SUTURE) ×2
SUT VIC AB 1 CT1 18XCR BRD 8 (SUTURE) IMPLANT
SUT VIC AB 1 CT1 27 (SUTURE) ×6
SUT VIC AB 1 CT1 27XBRD ANBCTR (SUTURE) ×3 IMPLANT
SUT VIC AB 1 CT1 8-18 (SUTURE)
SUT VIC AB 2-0 CT1 27 (SUTURE) ×6
SUT VIC AB 2-0 CT1 TAPERPNT 27 (SUTURE) ×3 IMPLANT
SUTURE FIBERWR #2 38 T-5 BLUE (SUTURE) IMPLANT
TUBE CONNECTING 12'X1/4 (SUCTIONS) ×1
TUBE CONNECTING 12X1/4 (SUCTIONS) ×2 IMPLANT
WATER STERILE IRR 1000ML POUR (IV SOLUTION) IMPLANT
WIRE FIX 2.8X200MM 10MM THREAD (WIRE) ×3 IMPLANT

## 2017-06-04 NOTE — Anesthesia Postprocedure Evaluation (Signed)
Anesthesia Post Note  Patient: Stephen Spears  Procedure(s) Performed: OPEN REDUCTION INTERNAL FIXATION (ORIF) ACETABULAR FRACTURE (Right ) OPEN REDUCTION INTERNAL FIXATION (ORIF) ANKLE FRACTURE (Right )     Patient location during evaluation: PACU Anesthesia Type: General Level of consciousness: awake Pain management: pain level controlled Vital Signs Assessment: post-procedure vital signs reviewed and stable Respiratory status: spontaneous breathing Cardiovascular status: stable Anesthetic complications: no    Last Vitals:  Vitals:   06/04/17 1645 06/04/17 1652  BP:    Pulse: 93 91  Resp: (!) 23 16  Temp:    SpO2: 97% 95%    Last Pain:  Vitals:   06/04/17 1623  TempSrc:   PainSc: 10-Worst pain ever        RLE Motor Response: Purposeful movement(able to wiggle toes) (06/04/17 1650) RLE Sensation: Full sensation (06/04/17 1650)      [name]
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
## 2017-06-04 NOTE — Anesthesia Procedure Notes (Signed)
Performed by: Dam Ashraf B, CRNA       
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
## 2017-06-04 NOTE — Anesthesia Preprocedure Evaluation (Addendum)
Anesthesia Evaluation  Patient identified by MRN, date of birth, ID band Patient awake    Reviewed: Allergy & Precautions, NPO status , Patient's Chart, lab work & pertinent test results  Airway Mallampati: IV  TM Distance: >3 FB Neck ROM: Full  Mouth opening: Limited Mouth Opening  Dental  (+) Dental Advisory Given   Pulmonary former smoker,    Pulmonary exam normal breath sounds clear to auscultation       Cardiovascular hypertension, (-) Past MI Normal cardiovascular exam Rhythm:Regular Rate:Normal     Neuro/Psych negative neurological ROS  negative psych ROS   GI/Hepatic Neg liver ROS, GERD  Controlled,  Endo/Other  neg diabetesObesity  Renal/GU negative Renal ROS  negative genitourinary   Musculoskeletal negative musculoskeletal ROS (+)   Abdominal   Peds  Hematology  (+) anemia , Mild thrombocytopenia   Anesthesia Other Findings Hx cleft palate s/p repair, s/p ORIF mandible  Reproductive/Obstetrics                           Anesthesia Physical  Anesthesia Plan  ASA: II  Anesthesia Plan: General   Post-op Pain Management:    Induction: Intravenous  PONV Risk Score and Plan: 3 and Treatment may vary due to age or medical condition, Midazolam, Dexamethasone and Ondansetron  Airway Management Planned: Oral ETT and Video Laryngoscope Planned  Additional Equipment: None  Intra-op Plan:   Post-operative Plan: Extubation in OR  Informed Consent: I have reviewed the patients History and Physical, chart, labs and discussed the procedure including the risks, benefits and alternatives for the proposed anesthesia with the patient or authorized representative who has indicated his/her understanding and acceptance.   Dental advisory given  Plan Discussed with: CRNA  Anesthesia Plan Comments: (Will utilize a BP cuff on each arm and alternate use periodically throughout case to  minimize risk of nerve injury)       Anesthesia Quick Evaluation

## 2017-06-04 NOTE — Progress Notes (Signed)
Called [facility] and [facility].

## 2017-06-04 NOTE — Telephone Encounter (Signed)
Called [facility] bed 12-C spoke with RN Roj, asked that she or charge nurse,case manager arrange transportation via [facility]  for the patient to be [facility]  Tomorrow for 11:30am radiation to patient's right hip, ask that she have [facility] 1100 and to premedicate patient for pain if needed before [facility], asked if she needed [facility] number [phone] but RN stated she had that and would call today before lunch to arrange the transportation , thanked RN 10:40 AM

## 2017-06-04 NOTE — Plan of Care (Signed)
  Activity: Risk for activity intolerance will decrease 06/04/2017 1851 - Progressing by Darrow BussingArcilla, Seidy Labreck M, RN   Nutrition: Adequate nutrition will be maintained 06/04/2017 1851 - Progressing by Darrow BussingArcilla, Adam Sanjuan M, RN   Coping: Level of anxiety will decrease 06/04/2017 1851 - Progressing by Darrow BussingArcilla, Malayna Noori M, RN   Pain Managment: General experience of comfort will improve 06/04/2017 1851 - Progressing by Darrow BussingArcilla, Marrah Vanevery M, RN

## 2017-06-04 NOTE — Transfer of Care (Signed)
Immediate Anesthesia Transfer of Care Note  Patient: Stephen Spears  Procedure(s) Performed: OPEN REDUCTION INTERNAL FIXATION (ORIF) ACETABULAR FRACTURE (Right ) OPEN REDUCTION INTERNAL FIXATION (ORIF) ANKLE FRACTURE (Right )  Patient Location: PACU  Anesthesia Type:General  Level of Consciousness: awake and alert   Airway & Oxygen Therapy: Patient Spontanous Breathing and Patient connected to nasal cannula oxygen  Post-op Assessment: Report given to RN and Post -op Vital signs reviewed and stable  Post vital signs: Reviewed and stable  Last Vitals:  Vitals:   06/04/17 0619 06/04/17 1623  BP: (!) 144/78   Pulse: 79   Resp: 16   Temp: 36.4 C 37.3 C  SpO2: 100%     Last Pain:  Vitals:   06/04/17 0619  TempSrc: Oral  PainSc:       Patients Stated Pain Goal: 0 (06/03/17 2048)  Complications: No apparent anesthesia complications

## 2017-06-04 NOTE — Anesthesia Procedure Notes (Signed)
Procedure Name: Intubation Date/Time: 06/04/2017 11:22 AM Performed by: Tillman AbideHawkins, Erik Nessel B, CRNA Pre-anesthesia Checklist: Patient identified, Emergency Drugs available, Suction available and Patient being monitored Patient Re-evaluated:Patient Re-evaluated prior to induction Oxygen Delivery Method: Circle System Utilized Preoxygenation: Pre-oxygenation with 100% oxygen Induction Type: IV induction Ventilation: Mask ventilation without difficulty Laryngoscope Size: Glidescope and 4 Grade View: Grade I Tube type: Oral Number of attempts: 1 Airway Equipment and Method: Stylet Placement Confirmation: ETT inserted through vocal cords under direct vision,  positive ETCO2 and breath sounds checked- equal and bilateral Secured at: 22 cm Tube secured with: Tape Dental Injury: Teeth and Oropharynx as per pre-operative assessment

## 2017-06-05 ENCOUNTER — Encounter: Payer: Self-pay | Admitting: *Deleted

## 2017-06-05 ENCOUNTER — Telehealth: Payer: Self-pay | Admitting: *Deleted

## 2017-06-05 ENCOUNTER — Encounter: Payer: Self-pay | Admitting: Radiation Oncology

## 2017-06-05 ENCOUNTER — Ambulatory Visit
Admit: 2017-06-05 | Discharge: 2017-06-05 | Disposition: A | Discharge status: Home / Self Care | Payer: Self-pay | Source: Ambulatory Visit | Attending: Radiation Oncology | Admitting: Radiation Oncology

## 2017-06-05 ENCOUNTER — Ambulatory Visit
Admit: 2017-06-05 | Discharge: 2017-06-05 | Disposition: A | Discharge status: Home / Self Care | Payer: Self-pay | Attending: Radiation Oncology | Admitting: Radiation Oncology

## 2017-06-05 DIAGNOSIS — S32421A Displaced fracture of posterior wall of right acetabulum, initial encounter for closed fracture: Secondary | ICD-10-CM

## 2017-06-05 DIAGNOSIS — M898X9 Other specified disorders of bone, unspecified site: Secondary | ICD-10-CM | POA: Insufficient documentation

## 2017-06-05 DIAGNOSIS — S72001A Fracture of unspecified part of neck of right femur, initial encounter for closed fracture: Secondary | ICD-10-CM

## 2017-06-05 LAB — CBC
HCT: 27.2 % — ABNORMAL LOW (ref 39.0–52.0)
HEMOGLOBIN: 9.4 g/dL — AB (ref 13.0–17.0)
MCH: 30.3 pg (ref 26.0–34.0)
MCHC: 34.6 g/dL (ref 30.0–36.0)
MCV: 87.7 fL (ref 78.0–100.0)
PLATELETS: 181 10*3/uL (ref 150–400)
RBC: 3.1 MIL/uL — AB (ref 4.22–5.81)
RDW: 12.9 % (ref 11.5–15.5)
WBC: 8.1 10*3/uL (ref 4.0–10.5)

## 2017-06-05 MED ORDER — OXYCODONE HCL 5 MG PO TABS
5.0000 mg | ORAL_TABLET | ORAL | Status: DC | PRN
  Administered 2017-06-05 – 2017-06-10 (×11): 10 mg via ORAL
  Administered 2017-06-11: 5 mg via ORAL
  Administered 2017-06-11: 10 mg via ORAL
  Administered 2017-06-12 (×2): 5 mg via ORAL
  Filled 2017-06-05 (×2): qty 2
  Filled 2017-06-05: qty 1
  Filled 2017-06-05 (×4): qty 2
  Filled 2017-06-05: qty 1
  Filled 2017-06-05 (×2): qty 2
  Filled 2017-06-05: qty 1
  Filled 2017-06-05 (×6): qty 2
  Filled 2017-06-05: qty 1

## 2017-06-05 MED ORDER — HYDROMORPHONE HCL 1 MG/ML IJ SOLN
1.0000 mg | Freq: Once | INTRAMUSCULAR | Status: AC
  Administered 2017-06-05: 1 mg via INTRAVENOUS
  Filled 2017-06-05: qty 1

## 2017-06-05 MED ORDER — HYDROMORPHONE HCL 4 MG/ML IJ SOLN: 1.0000 mg | Freq: Once | INTRAMUSCULAR | Status: DC

## 2017-06-05 MED ORDER — HYDROMORPHONE HCL 1 MG/ML IJ SOLN
1.0000 mg | INTRAMUSCULAR | Status: DC | PRN
  Administered 2017-06-05 – 2017-06-09 (×16): 1 mg via INTRAVENOUS
  Filled 2017-06-05 (×16): qty 1

## 2017-06-05 MED ORDER — ACETAMINOPHEN 500 MG PO TABS
1000.0000 mg | ORAL_TABLET | Freq: Three times a day (TID) | ORAL | Status: DC
  Administered 2017-06-05 – 2017-06-12 (×20): 1000 mg via ORAL
  Filled 2017-06-05 (×20): qty 2

## 2017-06-05 NOTE — Telephone Encounter (Signed)
Called and spoke wot [facility] dispatcher, patient is reday to go back to [facility] 12-C, per dispatcher he will send someone as soon as he can, they are running 1:06 PM

## 2017-06-05 NOTE — Progress Notes (Signed)
   Post Radiation Note:  Report called to Inpatient RN: Nita SellsMaryann Time: 06/05/2017,1:49 PM  Was radiation completed: Yes.    Were any PRN medications given: Yes.   Name and time: Dilaudid 1mg  /ml IV LAC at 1349pm  Were there any additional events: no  Lowella PettiesMcElroy, Edyn Popoca Bruner, RN 06/05/2017,1:49 PM
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
## 2017-06-05 NOTE — Progress Notes (Signed)
RN called [facility]. Stephen Spears states they should arrive within 1 hour [facility]

## 2017-06-05 NOTE — Progress Notes (Signed)
Normal saline flush via LAC, excellent , then per verbal order to give   1 mg dilaudid IV per Dr. Mitzi HansenMoody and Laurence AlyAlison Perkins PA,  Verified with Durwin Regesara Gordon, LPN, showed patient after he gave name and date of birth  The vial of dilaidid, 1mg  /ml dilaudid given slow push LAC IV, then flushed again with Normal saline, patient tolerated well 1:39 PM
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
## 2017-06-05 NOTE — Telephone Encounter (Signed)
Called and spoke with Stephen AbrahamsMary ann,  RN [facility] gave updated statsus, of pain medication and [facility] now 2:21 PM  2:21 PM
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
## 2017-06-05 NOTE — Progress Notes (Signed)
Patient completed, rad right hip, in room 1 at nursing, TV.on, call bell within reach, offered water as requested,warm blanket provided, called [facility] they are running a lot will come get patient as soon as they can, asked how pain was"10  On a 10 scale,on left foot asked if patient wanted us to medicate with Dilaudid, ", " will notify Algis DownsAlison Pa, NS with 20kcl infusing right hand at 1300ml/hr 1:24 PM

## 2017-06-05 NOTE — Addendum Note (Signed)
Encounter addended by: Lacy DuverneyMcElroy, Lemoyne Scarpati B, RN on: 06/05/2017 1:50 PM  Actions taken: Vitals modified, Sign clinical note

## 2017-06-05 NOTE — Progress Notes (Signed)
[facility] is transporting pt to [facility].

## 2017-06-05 NOTE — Progress Notes (Addendum)
   Pre-Radiation Note:  Inpatient nurse name: Armando ReichertMarian RN  Time Called: 12:11 PM  Inpatient nurse to call [facility]: Yes.   called yesterday   [facility] called to verify transportation: Yes.   Time:0735am  Patient Status: right heterotopic  Pain:  Pain medication given: Dilaudid 0941 today Mobility Ordersbed Treatment Site: right hip Additional Injuries: rib fractures, mandible fracture  Consent:    Is patient able to sign consent: Yes.   Family member called: NO Name/Time:      Stephen PettiesMcElroy, Stephen Rhew Bruner, RN 06/05/2017,12:11 PM

## 2017-06-05 NOTE — Progress Notes (Deleted)
Pt education reviewed lung cancer, sonafine cream, my business card aand Radiation therapy and you book gin, discussed skin irritation, loss appetite, fatigue,pain, difficulty swallowing throat changes, may need to eat 5-6 smaller meals and Glucerna in between, may need IVF'S if losing too much weight, increase protein indiet, apply sonafine to irritated skin or for itching after rad txs daily and prn, nothing 4 hours prior to rad txs, luke warm showers, unscented mild soap, pat dry, no rubbing,scrubbing or scratching areas treated, fatigue get plenty of exercise, rest and get enough sleep, sees MD weekly and prn, teach back given 12:40 PM

## 2017-06-05 NOTE — Telephone Encounter (Signed)
[facility] [place] [facility], he has patient to be here at cancer 1130am, thanked Doug 7:27 AM

## 2017-06-05 NOTE — Addendum Note (Signed)
Encounter addended by: Lacy DuverneyMcElroy, Tyrion Glaude B, RN on: 06/05/2017 1:40 PM  Actions taken: Visit diagnoses modified, Order list changed, Diagnosis association updated, MAR administration accepted, Sign clinical note

## 2017-06-05 NOTE — Progress Notes (Addendum)
   Pre-Radiation Transport Note: called [facility] this am at 0735am  And at 1245 bto come pick patient back up  IP nurse called: yes Time:  06/05/2017,12:14 PM  IV location:  LFA, RAC IV fluids: Normal saline  Last dose of pain medication given (name/time):  Dilaudid 1mg   0941am  Katherene PontoMcElroy, Gloriann LoanJanice Bruner, RN 06/05/2017,12:14 PM
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
## 2017-06-06 ENCOUNTER — Encounter (HOSPITAL_COMMUNITY): Payer: Self-pay | Admitting: Student

## 2017-06-06 LAB — CBC
HCT: 28.2 % — ABNORMAL LOW (ref 39.0–52.0)
Hemoglobin: 9.8 g/dL — ABNORMAL LOW (ref 13.0–17.0)
MCH: 31.1 pg (ref 26.0–34.0)
MCHC: 34.8 g/dL (ref 30.0–36.0)
MCV: 89.5 fL (ref 78.0–100.0)
PLATELETS: 233 10*3/uL (ref 150–400)
RBC: 3.15 MIL/uL — ABNORMAL LOW (ref 4.22–5.81)
RDW: 13.5 % (ref 11.5–15.5)
WBC: 7.6 10*3/uL (ref 4.0–10.5)

## 2017-06-06 LAB — POCT I-STAT 7, (LYTES, BLD GAS, ICA,H+H)
ACID-BASE EXCESS: 3 mmol/L — AB (ref 0.0–2.0)
Bicarbonate: 28.3 mmol/L — ABNORMAL HIGH (ref 20.0–28.0)
Calcium, Ion: 1.14 mmol/L — ABNORMAL LOW (ref 1.15–1.40)
HEMATOCRIT: 25 % — AB (ref 39.0–52.0)
HEMOGLOBIN: 8.5 g/dL — AB (ref 13.0–17.0)
O2 SAT: 95 %
PO2 ART: 75 mmHg — AB (ref 83.0–108.0)
Patient temperature: 37
Potassium: 4.6 mmol/L (ref 3.5–5.1)
SODIUM: 138 mmol/L (ref 135–145)
TCO2: 30 mmol/L (ref 22–32)
pCO2 arterial: 44.2 mmHg (ref 32.0–48.0)
pH, Arterial: 7.415 (ref 7.350–7.450)

## 2017-06-06 LAB — BASIC METABOLIC PANEL
ANION GAP: 6 (ref 5–15)
BUN: 17 mg/dL (ref 6–20)
CALCIUM: 8.6 mg/dL — AB (ref 8.9–10.3)
CO2: 25 mmol/L (ref 22–32)
Chloride: 107 mmol/L (ref 101–111)
Creatinine, Ser: 1.01 mg/dL (ref 0.61–1.24)
GFR calc Af Amer: >60 mL/min (ref >60)
GLUCOSE: 113 mg/dL — AB (ref 65–99)
Potassium: 3.5 mmol/L (ref 3.5–5.1)
Sodium: 138 mmol/L (ref 135–145)
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
## 2017-06-06 NOTE — Progress Notes (Signed)
Acute blood loss anemia
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
## 2017-06-07 LAB — TYPE AND SCREEN
ABO/RH(D): O POS
Antibody Screen: NEGATIVE
UNIT DIVISION: 0
UNIT DIVISION: 0
UNIT DIVISION: 0
Unit division: 0

## 2017-06-07 LAB — BPAM RBC
BLOOD PRODUCT EXPIRATION DATE: 201812072359
BLOOD PRODUCT EXPIRATION DATE: 201812102359
Blood Product Expiration Date: 201811262359
Blood Product Expiration Date: 201812112359
ISSUE DATE / TIME: 201811191234
ISSUE DATE / TIME: 201811191234
UNIT TYPE AND RH: 5100
Unit Type and Rh: 5100
Unit Type and Rh: 5100
Unit Type and Rh: 5100

## 2017-06-07 NOTE — Plan of Care (Signed)
  Clinical Measurements: Ability to maintain clinical measurements within normal limits will improve 06/07/2017 1235 - Progressing by Darrow BussingArcilla, Kaylee Trivett M, RN   Activity: Risk for activity intolerance will decrease 06/07/2017 1235 - Progressing by Darrow BussingArcilla, Cass Vandermeulen M, RN   Coping: Level of anxiety will decrease 06/07/2017 1235 - Progressing by Darrow BussingArcilla, Tyrae Alcoser M, RN   Pain Managment: General experience of comfort will improve 06/07/2017 1235 - Progressing by Darrow BussingArcilla, Lennox Leikam M, RN   Activity: Ability to ambulate and perform ADLs will improve 06/07/2017 1235 - Progressing by Darrow BussingArcilla, Caydence Koenig M, RN
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
## 2017-06-08 MED ORDER — SENNA 8.6 MG PO TABS
1.0000 | ORAL_TABLET | Freq: Two times a day (BID) | ORAL | Status: DC
  Administered 2017-06-08 – 2017-06-10 (×3): 8.6 mg via ORAL
  Filled 2017-06-08 (×8): qty 1

## 2017-06-08 MED ORDER — POLYETHYLENE GLYCOL 3350 17 G PO PACK
17.0000 g | PACK | Freq: Two times a day (BID) | ORAL | Status: AC
  Administered 2017-06-08 – 2017-06-09 (×2): 17 g via ORAL
  Filled 2017-06-08 (×3): qty 1
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
## 2017-06-08 NOTE — Progress Notes (Signed)
No local LOG SNF options available at this time- spoke with medical director who [facility]ves [facility]- ref[facility] sent awaiting determination  CSW will continue to follow  Burna SisJenna H. Maridee Slape, LCSW Clinical Social Worker [phone]

## 2017-06-09 NOTE — Progress Notes (Signed)
CS[facility] with [facility] and left VM for Tamika to call CSW back regarding placement options for pt.       Claude MangesKierra S. Harleyquinn Gasser, MSW, LCSW-A Emergency Department Clinical Social Worker [phone]
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
## 2017-06-09 NOTE — Plan of Care (Signed)
  Progressing Health Behavior/Discharge Planning: Ability to manage health-related needs will improve 06/09/2017 1211 - Progressing by Darreld Mcleanox, Shayonna Ocampo, RN Clinical Measurements: Ability to maintain clinical measurements within normal limits will improve 06/09/2017 1211 - Progressing by Darreld Mcleanox, Margaret Cockerill, RN Will remain free from infection 06/09/2017 1211 - Progressing by Darreld Mcleanox, Kalise Fickett, RN Diagnostic test results will improve 06/09/2017 1211 - Progressing by Darreld Mcleanox, Sanel Stemmer, RN Respiratory complications will improve 06/09/2017 1211 - Progressing by Darreld Mcleanox, Ayeisha Lindenberger, RN Cardiovascular complication will be avoided 06/09/2017 1211 - Progressing by Darreld Mcleanox, Eunice Winecoff, RN Activity: Risk for activity intolerance will decrease 06/09/2017 1211 - Progressing by Darreld Mcleanox, Daelyn Pettaway, RN Coping: Level of anxiety will decrease 06/09/2017 1211 - Progressing by Darreld Mcleanox, Geriann Lafont, RN Elimination: Will not experience complications related to bowel motility 06/09/2017 1211 - Progressing by Darreld Mcleanox, Natoshia Souter, RN Will not experience complications related to urinary retention 06/09/2017 1211 - Progressing by Darreld Mcleanox, Breean Nannini, RN Pain Managment: General experience of comfort will improve 06/09/2017 1211 - Progressing by Darreld Mcleanox, Melisssa Donner, RN Activity: Ability to ambulate and perform ADLs will improve 06/09/2017 1211 - Progressing by Darreld Mcleanox, Bralynn Donado, RN Clinical Measurements: Postoperative complications will be avoided or minimized 06/09/2017 1211 - Progressing by Darreld Mcleanox, Layten Aiken, RN Self-Concept: Ability to maintain and perform role responsibilities to the fullest extent possible will improve 06/09/2017 1211 - Progressing by Darreld Mcleanox, Sabrina Keough, RN Pain Management: Pain level will decrease 06/09/2017 1211 - Progressing by Darreld Mcleanox, Jeanet Lupe, RN
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
## 2017-06-11 MED ORDER — POLYETHYLENE GLYCOL 3350 17 G PO PACK: 17.0000 g | PACK | Freq: Every day | ORAL | Status: DC | PRN
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
## 2017-06-11 NOTE — Progress Notes (Signed)
  Radiation Oncology         [phone] ________________________________  Name: Stephen Spears MRN: [id number]  Date: 06/05/2017  DOB: 10/12/1965  End of Treatment Note  Diagnosis:   Postoperative heterotopic ossification     Indication for treatment:  Curative       Radiation treatment dates:   06/05/2017  Site/dose:   The patient was treated to a dose of 7 Gy to the right hip. This was accomplished with AP and PA fields.  Narrative: The patient tolerated radiation treatment relatively well.   No difficulties.  Plan: The patient has completed radiation treatment. The patient will return to radiation oncology clinic on an as needed basis. ________________________________  Radene GunningJohn S. Damareon Lanni, MD, PhD  This document serves as a record of services personally performed by Stephen PufferJohn Karyme Mcconathy, MD. It was created on his behalf by Ivar BuryHaley Woodruff, a trained medical scribe. The creation of this record is based on the scribe's personal observations and the provider's statements to them. This document has been checked and approved by the attending provider.

## 2017-06-11 NOTE — Progress Notes (Signed)
Orthopedic Tech Progress Note Patient Details:  Mertie Mooresracy D Liwanag 01/07/1966 [id number]  Ortho Devices Type of Ortho Device: CAM walker Ortho Device/Splint Interventions: Marisa SprinklesOrdered   Lora Chavers C Isra Lindy 06/11/2017, 12:31 PM

## 2017-06-11 NOTE — Progress Notes (Signed)
Patient ID: Stephen Spears, male   DOB: 09/22/1965, 51 y.o.   MRN: [id number] 10 days postop.  Doing well with respect to his facial injuries.  Some swelling has persisted but no signs of infection.  Intraoral laceration has healed very nicely.  No signs of infection or dehiscence.  Skin closure is also intact and appears clean.  There is no drainage.  There is no erythema.  Sutures were removed from the skin.  He is tolerating a soft diet well.  Continue with soft diet for a total of 6 weeks.  I will have him follow-up as an outpatient in about 2 weeks.
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
## 2017-06-11 NOTE — Discharge Instructions (Signed)
Soft diet

## 2017-06-12 MED ORDER — ENOXAPARIN SODIUM 40 MG/0.4ML ~~LOC~~ SOLN: 40.0000 mg | SUBCUTANEOUS | Status: DC

## 2017-06-12 MED ORDER — SENNA 8.6 MG PO TABS: 1.0000 | ORAL_TABLET | Freq: Two times a day (BID) | ORAL | 0 refills | Status: DC

## 2017-06-12 MED ORDER — OXYCODONE HCL 5 MG PO TABS: 5.0000 mg | ORAL_TABLET | ORAL | 0 refills | Status: DC | PRN
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
## 2017-06-12 NOTE — Social Work (Addendum)
CSW received call back from [place] at [facility] advising that they can offer a bed for pt with Two week LOG.  CSW will f/u on transport.  CSW will f/u on discharge.  Keene BreathPatricia Kaesha Kirsch, LCSW Clinical Social Worker [phone]

## 2017-06-12 NOTE — Progress Notes (Signed)
Report called and given to Alanda Amassrystal Wilson at [facility]. Answered any questions to satisfaction. Pt premedicated with pain meds before transport. All belongings gathered and put in bags. Awaiting [facility] for transport. Pt in no distress at this time.

## 2017-06-12 NOTE — Social Work (Signed)
Clinical Social Worker facilitated patient discharge including contacting patient family and facility to confirm patient discharge plans.  Clinical information faxed to facility and family agreeable with plan.    CSW arranged ambulance transport via [facility] to [facility].    RN to call [phone] to give report prior to discharge.  Clinical Social Worker will sign off for now as social work intervention is no longer needed. Please consult us again if new need arises.  Keene BreathPatricia Thirza Pellicano, LCSW Clinical Social Worker [phone]
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
## 2017-06-12 NOTE — Social Work (Signed)
CSW met with patient to discuss SNf bed at [facility]. Pt not sure if he wants to go to [facility] for SNF placement. Pt indicated that he thought he was going to [place].  CSW advised that she has not heard back from [facility] as previous CSW indicated that it was sent to them.  Pt then asked about going home. CSW reiterated that if he went home, he may not get additional services at home as he is unemployed.  Pt indicated he needed some time to decide if he will want to go to [place].  CSW then called Suanne Marker in admissions at [facility] and left message requesting a call back.  CSW will advise Almyra Free, Trauma RNCM so that she is aware.  Elissa Hefty, LCSW Clinical Social Worker [phone]
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
## 2017-06-12 NOTE — Progress Notes (Signed)
[facility] is here to take pt. All belongings with pt.

## 2017-06-12 NOTE — Social Work (Signed)
CS[facility] to see if she had a chance to review clinicals and could offer a bed with 2 week LOG. Stephen Spears indicated tha tshe will review and call CSW back.  CSW will continue to follow up.  Keene BreathPatricia Kaely Hollan, LCSW Clinical Social Worker [phone]

## 2017-06-12 NOTE — Plan of Care (Signed)
  Activity: Risk for activity intolerance will decrease 06/12/2017 1420 - Progressing by Governor Rooks, RN   Clinical Measurements: Will remain free from infection 06/12/2017 1420 - Adequate for Discharge by Governor Rooks, RN   Pain Managment: General experience of comfort will improve 06/12/2017 1420 - Adequate for Discharge by Governor Rooks, RN   Activity: Ability to ambulate and perform ADLs will improve 06/12/2017 1420 - Adequate for Discharge by Governor Rooks, RN   Pain Management: Pain level will decrease 06/12/2017 1420 - Adequate for Discharge by Governor Rooks, RN   Completed/Met Clinical Measurements: Respiratory complications will improve 06/12/2017 1420 - Completed/Met by Governor Rooks, RN Cardiovascular complication will be avoided 06/12/2017 1420 - Completed/Met by Governor Rooks, RN Coping: Level of anxiety will decrease 06/12/2017 1420 - Completed/Met by Governor Rooks, RN Elimination: Will not experience complications related to bowel motility 06/12/2017 1420 - Completed/Met by Governor Rooks, RN Will not experience complications related to urinary retention 06/12/2017 1420 - Completed/Met by Governor Rooks, RN

## 2017-06-12 NOTE — Clinical Social Work Placement (Signed)
   CLINICAL SOCIAL WORK PLACEMENT  NOTE  Date:  06/12/2017  Patient Details  Name: Stephen Spears MRN: [id number] Date of Birth: 05/18/1966  Clinical Social Work is seeking post-discharge placement for this patient at the Skilled  Nursing Facility level of care (*CSW will initial, date and re-position this form in  chart as items are completed):  Yes   Patient/family provided with [place] Clinical Social Work Department's list of facilities offering this level of care within the geographic area requested by the patient (or if unable, by the patient's family).  Yes   Patient/family informed of their freedom to choose among providers that offer the needed level of care, that participate in Medicare, Medicaid or managed care program needed by the patient, have an available bed and are willing to accept the patient.  Yes   Patient/family informed of 's ownership interest in [facility] and [facility], as well as of the fact that they are under no obligation to receive care at these facilities.  PASRR submitted to EDS on       PASRR number received on 06/08/17     Existing PASRR number confirmed on       FL2 transmitted to all facilities in geographic area requested by pt/family on 06/08/17     FL2 transmitted to all facilities within larger geographic area on       Patient informed that his/her managed care company has contracts with or will negotiate with certain facilities, including the following:        Yes   Patient/family informed of bed offers received.  [facility]     Physician recommends and patient chooses bed at      [facility] on 06/12/17.  Patient to be transferred to facility by [facility]     Patient family notified on 06/12/17 of transfer.  Name of family member notified:  pt responsible for self     PHYSICIAN Please prepare priority discharge summary, including  medications, Please prepare prescriptions     Additional Comment:    _______________________________________________ Tresa MoorePatricia V Aina Rossbach, LCSW 06/12/2017, 10:51 AM

## 2017-06-12 NOTE — Clinical Social Work Placement (Signed)
   CLINICAL SOCIAL WORK PLACEMENT  NOTE  Date:  06/12/2017  Patient Details  Name: Stephen Spears MRN: [id number] Date of Birth: 09/22/1965  Clinical Social Work is seeking post-discharge placement for this patient at the Skilled  Nursing Facility level of care (*CSW will initial, date and re-position this form in  chart as items are completed):  Yes   Patient/family provided with [facility] Clinical Social Work Department's list of facilities offering this level of care within the geographic area requested by the patient (or if unable, by the patient's family).  Yes   Patient/family informed of their freedom to choose among providers that offer the needed level of care, that participate in Medicare, Medicaid or managed care program needed by the patient, have an available bed and are willing to accept the patient.  Yes   Patient/family informed of 's ownership interest in [facility] and [facility], as well as of the fact that they are under no obligation to receive care at these facilities.  PASRR submitted to EDS on       PASRR number received on 06/08/17     Existing PASRR number confirmed on       FL2 transmitted to all facilities in geographic area requested by pt/family on 06/08/17     FL2 transmitted to all facilities within larger geographic area on       Patient informed that his/her managed care company has contracts with or will negotiate with certain facilities, including the following:        Yes   Patient/family informed of bed offers received.  Patient [facility]     Physician recommends and patient chooses bed at      Patient to be [facility] on 06/12/17.  Patient to be transferred to facility by [facility]     Patient family notified on 06/12/17 of transfer.  Name of family member notified:  pt responsible for self     PHYSICIAN Please prepare priority discharge summary, including  medications, Please prepare prescriptions     Additional Comment:    _______________________________________________ Tresa MoorePatricia V Grabiel Schmutz, LCSW 06/12/2017, 12:46 PM

## 2017-06-15 ENCOUNTER — Encounter (HOSPITAL_COMMUNITY): Payer: Self-pay | Admitting: Otolaryngology

## 2017-06-15 NOTE — Anesthesia Postprocedure Evaluation (Signed)
Anesthesia Post Note  Patient: Stephen Spears  Procedure(s) Performed: OPEN REDUCTION INTERNAL FIXATION (ORIF) MANDIBULAR FRACTURE (N/A ) FACIAL LACERATION REPAIR (N/A ) CLOSED REDUCTION HIP (Right ) CLOSED REDUCTION ANKLE (Right )     Patient location during evaluation: PACU Anesthesia Type: General Level of consciousness: patient cooperative Pain management: pain level controlled Vital Signs Assessment: post-procedure vital signs reviewed and stable Respiratory status: spontaneous breathing, nonlabored ventilation, respiratory function stable and patient connected to nasal cannula oxygen Cardiovascular status: blood pressure returned to baseline and stable Postop Assessment: no apparent nausea or vomiting Anesthetic complications: no    Last Vitals:  Vitals:   06/12/17 0500 06/12/17 1300  BP: 122/76 133/86  Pulse: 74 68  Resp: 20 18  Temp: 37.1 C (!) 36.2 C  SpO2: 99% 100%    Last Pain:  Vitals:   06/12/17 1300  TempSrc: Oral  PainSc:                  Anndrea Mihelich

## 2017-11-14 ENCOUNTER — Ambulatory Visit: Payer: Self-pay | Admitting: Internal Medicine

## 2017-11-14 ENCOUNTER — Encounter: Payer: Self-pay | Admitting: Internal Medicine

## 2017-11-14 DIAGNOSIS — I10 Essential (primary) hypertension: Secondary | ICD-10-CM

## 2017-11-14 MED ORDER — HYDROCHLOROTHIAZIDE 25 MG PO TABS: 25.0000 mg | ORAL_TABLET | Freq: Every day | ORAL | 3 refills | Status: DC
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
## 2017-11-14 NOTE — Progress Notes (Deleted)
  Subjective:     Patient ID: Stephen Spears, male   DOB: [birth year], 52 y.o.   MRN: [id number]  HPI   Review of Systems     Objective:   Physical Exam  Constitutional: He is oriented to person, place, and time.  Cardiovascular: Normal rate, regular rhythm and normal heart sounds.  Pulmonary/Chest: Effort normal and breath sounds normal.  Neurological: He is alert and oriented to person, place, and time.   BP (!) 148/95   Pulse 69   Temp 97.6 F (36.4 C)   Wt 222 lb 6.4 oz (100.9 kg)   BMI 30.16 kg/m       Assessment:      ***    Plan:     ***

## 2017-11-15 LAB — COMPREHENSIVE METABOLIC PANEL
A/G RATIO: 1.7 (ref 1.2–2.2)
ALK PHOS: 83 IU/L (ref 39–117)
ALT: 27 IU/L (ref 0–44)
AST: 18 IU/L (ref 0–40)
Albumin: 4.6 g/dL (ref 3.5–5.5)
BUN/Creatinine Ratio: 14 (ref 9–20)
BUN: 14 mg/dL (ref 6–24)
Bilirubin Total: 1.3 mg/dL — ABNORMAL HIGH (ref 0.0–1.2)
CO2: 24 mmol/L (ref 20–29)
Calcium: 9.3 mg/dL (ref 8.7–10.2)
Chloride: 103 mmol/L (ref 96–106)
Creatinine, Ser: 0.97 mg/dL (ref 0.76–1.27)
GFR calc Af Amer: 106 mL/min/{1.73_m2} (ref >59)
GFR calc non Af Amer: 91 mL/min/{1.73_m2} (ref >59)
GLOBULIN, TOTAL: 2.7 g/dL (ref 1.5–4.5)
Glucose: 94 mg/dL (ref 65–99)
POTASSIUM: 4.3 mmol/L (ref 3.5–5.2)
SODIUM: 141 mmol/L (ref 134–144)
Total Protein: 7.3 g/dL (ref 6.0–8.5)

## 2017-11-15 LAB — CBC
Hematocrit: 41.5 % (ref 37.5–51.0)
Hemoglobin: 13.6 g/dL (ref 13.0–17.7)
MCH: 28.5 pg (ref 26.6–33.0)
MCHC: 32.8 g/dL (ref 31.5–35.7)
MCV: 87 fL (ref 79–97)
PLATELETS: 245 10*3/uL (ref 150–379)
RBC: 4.78 x10E6/uL (ref 4.14–5.80)
RDW: 15.8 % — ABNORMAL HIGH (ref 12.3–15.4)
WBC: 4.1 10*3/uL (ref 3.4–10.8)

## 2017-11-15 LAB — URINALYSIS
Bilirubin, UA: NEGATIVE
Glucose, UA: NEGATIVE
KETONES UA: NEGATIVE
LEUKOCYTES UA: NEGATIVE
Nitrite, UA: NEGATIVE
PROTEIN UA: NEGATIVE
RBC UA: NEGATIVE
SPEC GRAV UA: 1.013 (ref 1.005–1.030)
Urobilinogen, Ur: 0.2 mg/dL (ref 0.2–1.0)
pH, UA: 5.5 (ref 5.0–7.5)

## 2017-11-15 LAB — LIPID PANEL
CHOL/HDL RATIO: 3.8 ratio (ref 0.0–5.0)
Cholesterol, Total: 190 mg/dL (ref 100–199)
HDL: 50 mg/dL (ref >39)
LDL Calculated: 114 mg/dL — ABNORMAL HIGH (ref 0–99)
TRIGLYCERIDES: 132 mg/dL (ref 0–149)
VLDL Cholesterol Cal: 26 mg/dL (ref 5–40)

## 2017-11-15 LAB — TSH: TSH: 1.79 u[IU]/mL (ref 0.450–4.500)

## 2017-11-21 ENCOUNTER — Ambulatory Visit: Payer: Self-pay | Admitting: Pharmacy Technician

## 2017-11-21 DIAGNOSIS — Z79899 Other long term (current) drug therapy: Secondary | ICD-10-CM

## 2017-11-21 NOTE — Progress Notes (Signed)
Completed [facility] application and contract.  Patient agreed to all terms of the [facility] contract.    Patient approved to receive medication assistance at [facility] through 2019, as long as eligibility criteria continues to be met.    Provided patient with community resource material based on his particular needs.    Stephen Spears J. Stephen Spears Care Manager [facility]  

## 2018-01-16 ENCOUNTER — Ambulatory Visit: Payer: Self-pay | Admitting: Internal Medicine

## 2018-01-16 ENCOUNTER — Other Ambulatory Visit: Payer: Self-pay

## 2018-01-16 ENCOUNTER — Encounter: Payer: Self-pay | Admitting: Internal Medicine

## 2018-01-16 VITALS — BP 142/87 | HR 74 | Temp 98.2°F | Ht 74.0 in | Wt 211.0 lb

## 2018-01-16 DIAGNOSIS — I10 Essential (primary) hypertension: Secondary | ICD-10-CM
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
## 2018-04-10 ENCOUNTER — Other Ambulatory Visit: Payer: Self-pay

## 2018-04-10 DIAGNOSIS — I10 Essential (primary) hypertension: Secondary | ICD-10-CM

## 2018-04-11 LAB — CBC
Hematocrit: 42.1 % (ref 37.5–51.0)
Hemoglobin: 14.4 g/dL (ref 13.0–17.7)
MCH: 30.8 pg (ref 26.6–33.0)
MCHC: 34.2 g/dL (ref 31.5–35.7)
MCV: 90 fL (ref 79–97)
Platelets: 253 10*3/uL (ref 150–450)
RBC: 4.68 x10E6/uL (ref 4.14–5.80)
RDW: 13.8 % (ref 12.3–15.4)
WBC: 4.3 10*3/uL (ref 3.4–10.8)

## 2018-04-11 LAB — COMPREHENSIVE METABOLIC PANEL
ALT: 31 IU/L (ref 0–44)
AST: 22 IU/L (ref 0–40)
Albumin/Globulin Ratio: 1.9 (ref 1.2–2.2)
Albumin: 4.4 g/dL (ref 3.5–5.5)
Alkaline Phosphatase: 78 IU/L (ref 39–117)
BILIRUBIN TOTAL: 0.9 mg/dL (ref 0.0–1.2)
BUN / CREAT RATIO: 16 (ref 9–20)
BUN: 16 mg/dL (ref 6–24)
CHLORIDE: 101 mmol/L (ref 96–106)
CO2: 24 mmol/L (ref 20–29)
Calcium: 9.3 mg/dL (ref 8.7–10.2)
Creatinine, Ser: 1.02 mg/dL (ref 0.76–1.27)
GFR calc Af Amer: 99 mL/min/{1.73_m2} (ref >59)
GFR calc non Af Amer: 86 mL/min/{1.73_m2} (ref >59)
GLUCOSE: 105 mg/dL — AB (ref 65–99)
Globulin, Total: 2.3 g/dL (ref 1.5–4.5)
Potassium: 3.8 mmol/L (ref 3.5–5.2)
Sodium: 142 mmol/L (ref 134–144)
Total Protein: 6.7 g/dL (ref 6.0–8.5)

## 2018-04-17 ENCOUNTER — Ambulatory Visit: Payer: Self-pay | Admitting: Internal Medicine

## 2018-04-17 ENCOUNTER — Encounter: Payer: Self-pay | Admitting: Internal Medicine

## 2018-04-17 VITALS — BP 134/87 | HR 75 | Temp 98.1°F | Ht 72.0 in | Wt 206.7 lb

## 2018-04-17 DIAGNOSIS — I10 Essential (primary) hypertension: Secondary | ICD-10-CM
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
## 2018-10-23 ENCOUNTER — Ambulatory Visit: Payer: Self-pay | Admitting: Internal Medicine

## 2019-04-01 ENCOUNTER — Telehealth: Payer: Self-pay | Admitting: Pharmacy Technician

## 2019-04-01 NOTE — Telephone Encounter (Signed)
Patient has not requested medication assistance from [facility] since November 2019.  Also, patient failed to provide 2020 poi.  No additional medication assistance will be provided by [facility] without the required proof of income documentation.  Patient notified by letter.  [facility]

## 2019-10-16 IMAGING — DX DG ANKLE COMPLETE 3+V*R*
3 series · 3 of 3 positions shown · non-contrast
Comparison: Right ankle radiographs obtained earlier today

CLINICAL DATA: Status post reduction of a right ankle
fracture/subluxation.

EXAM:
RIGHT ANKLE - COMPLETE 3+ VIEW

[ankle ap]
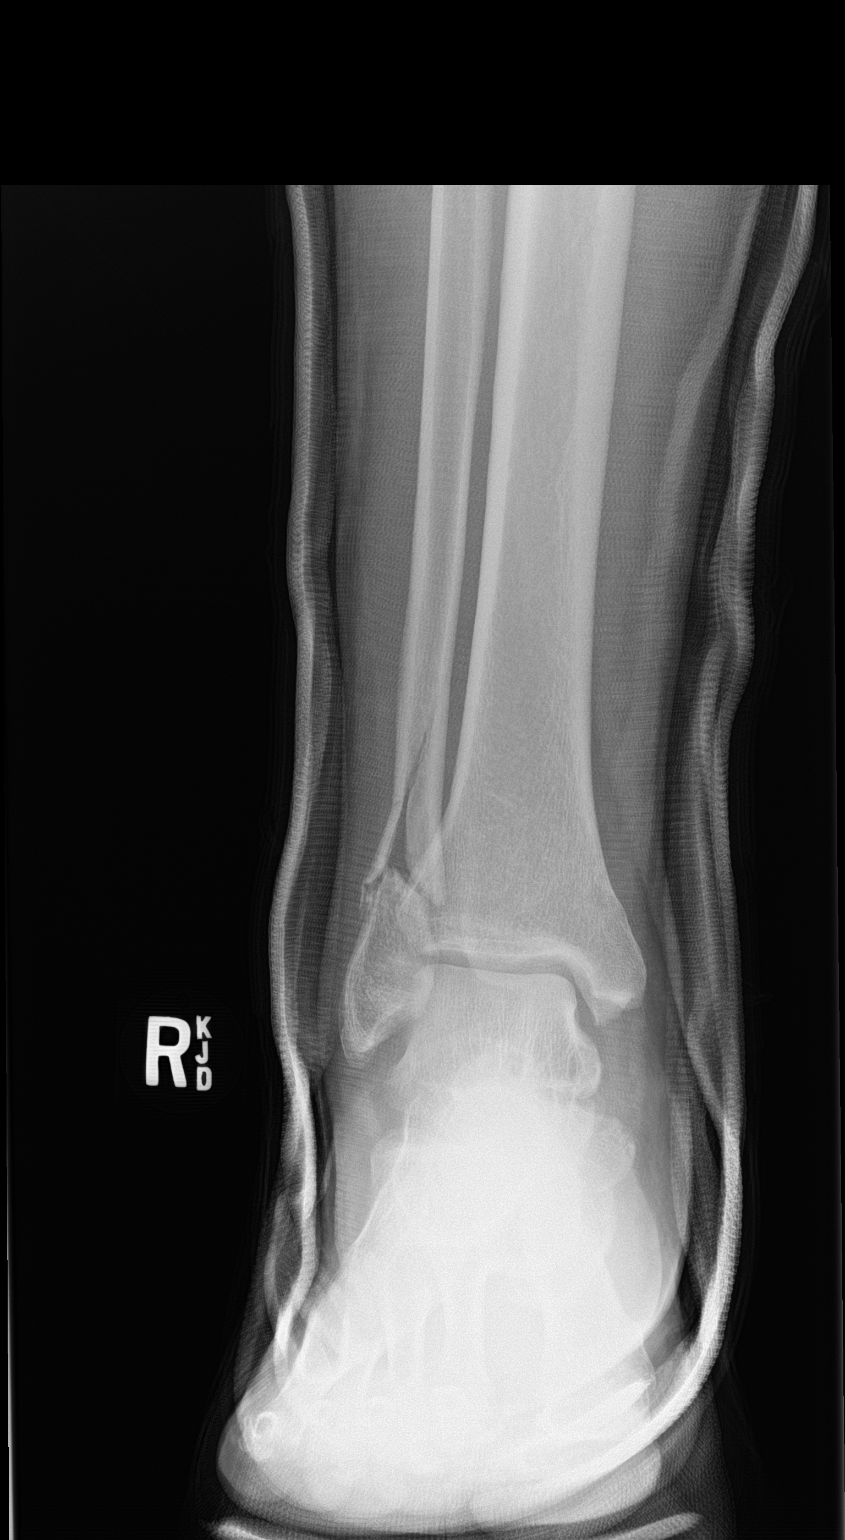

[ankle obl]
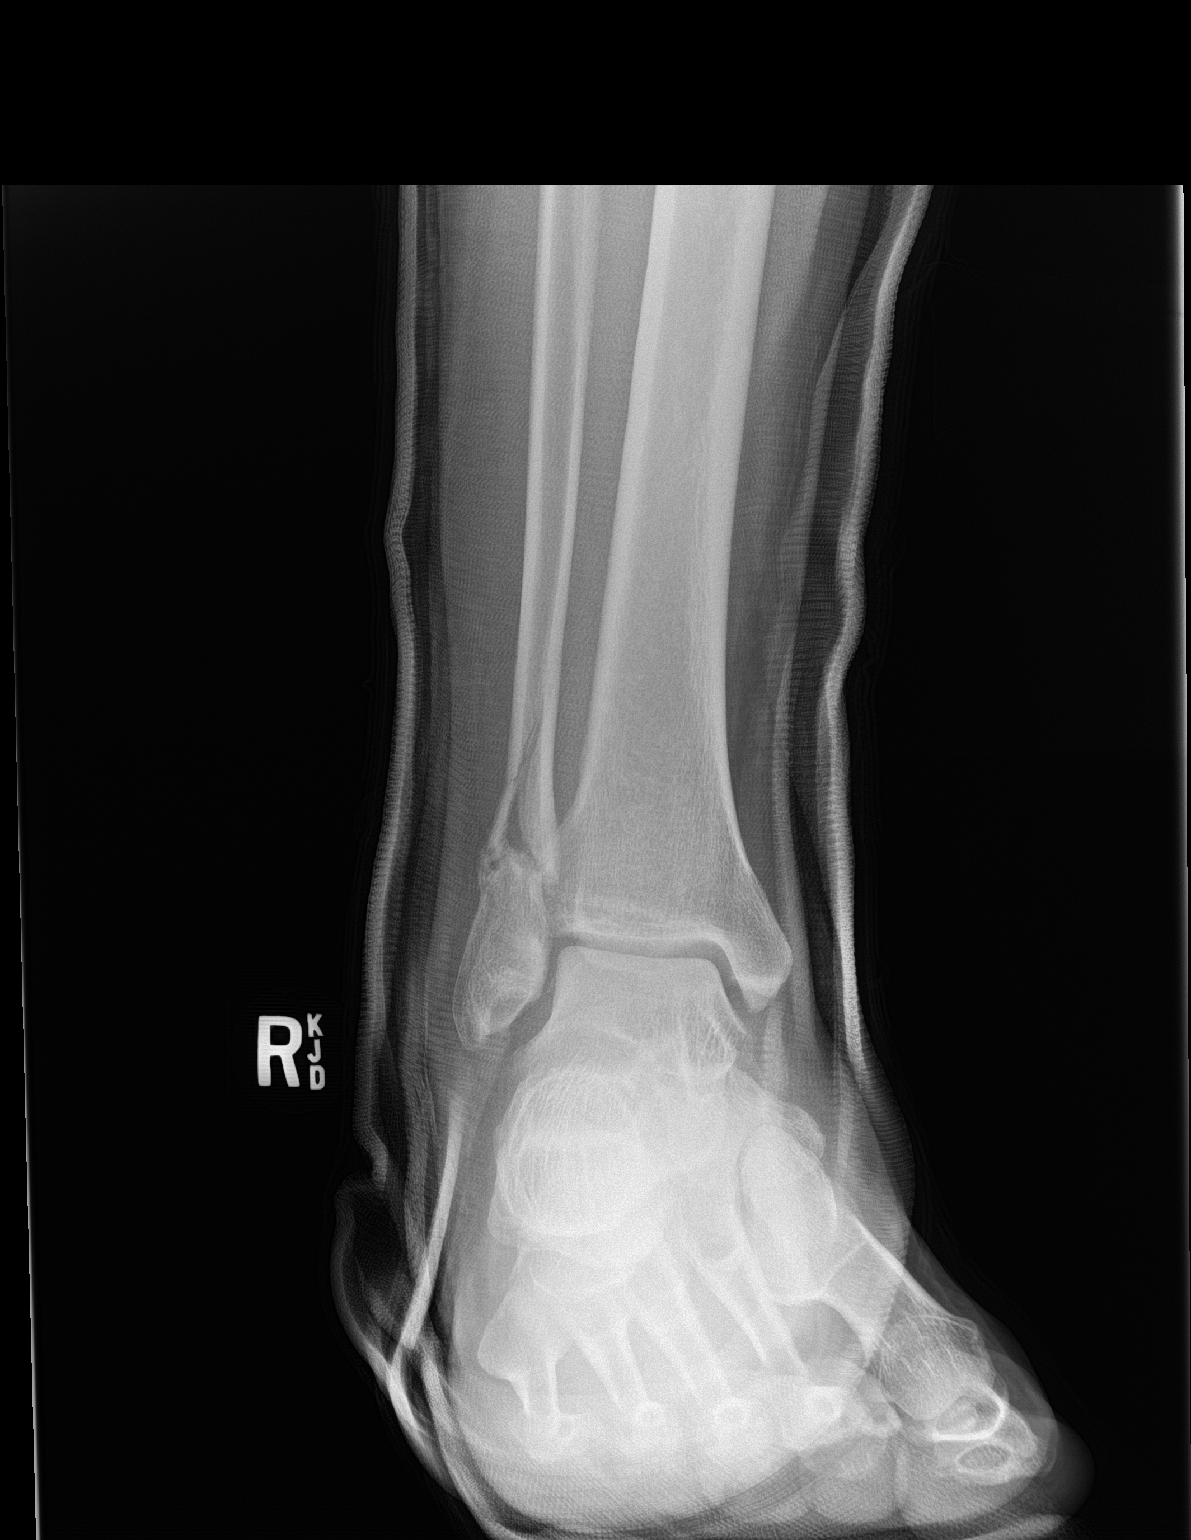

[ankle lat]
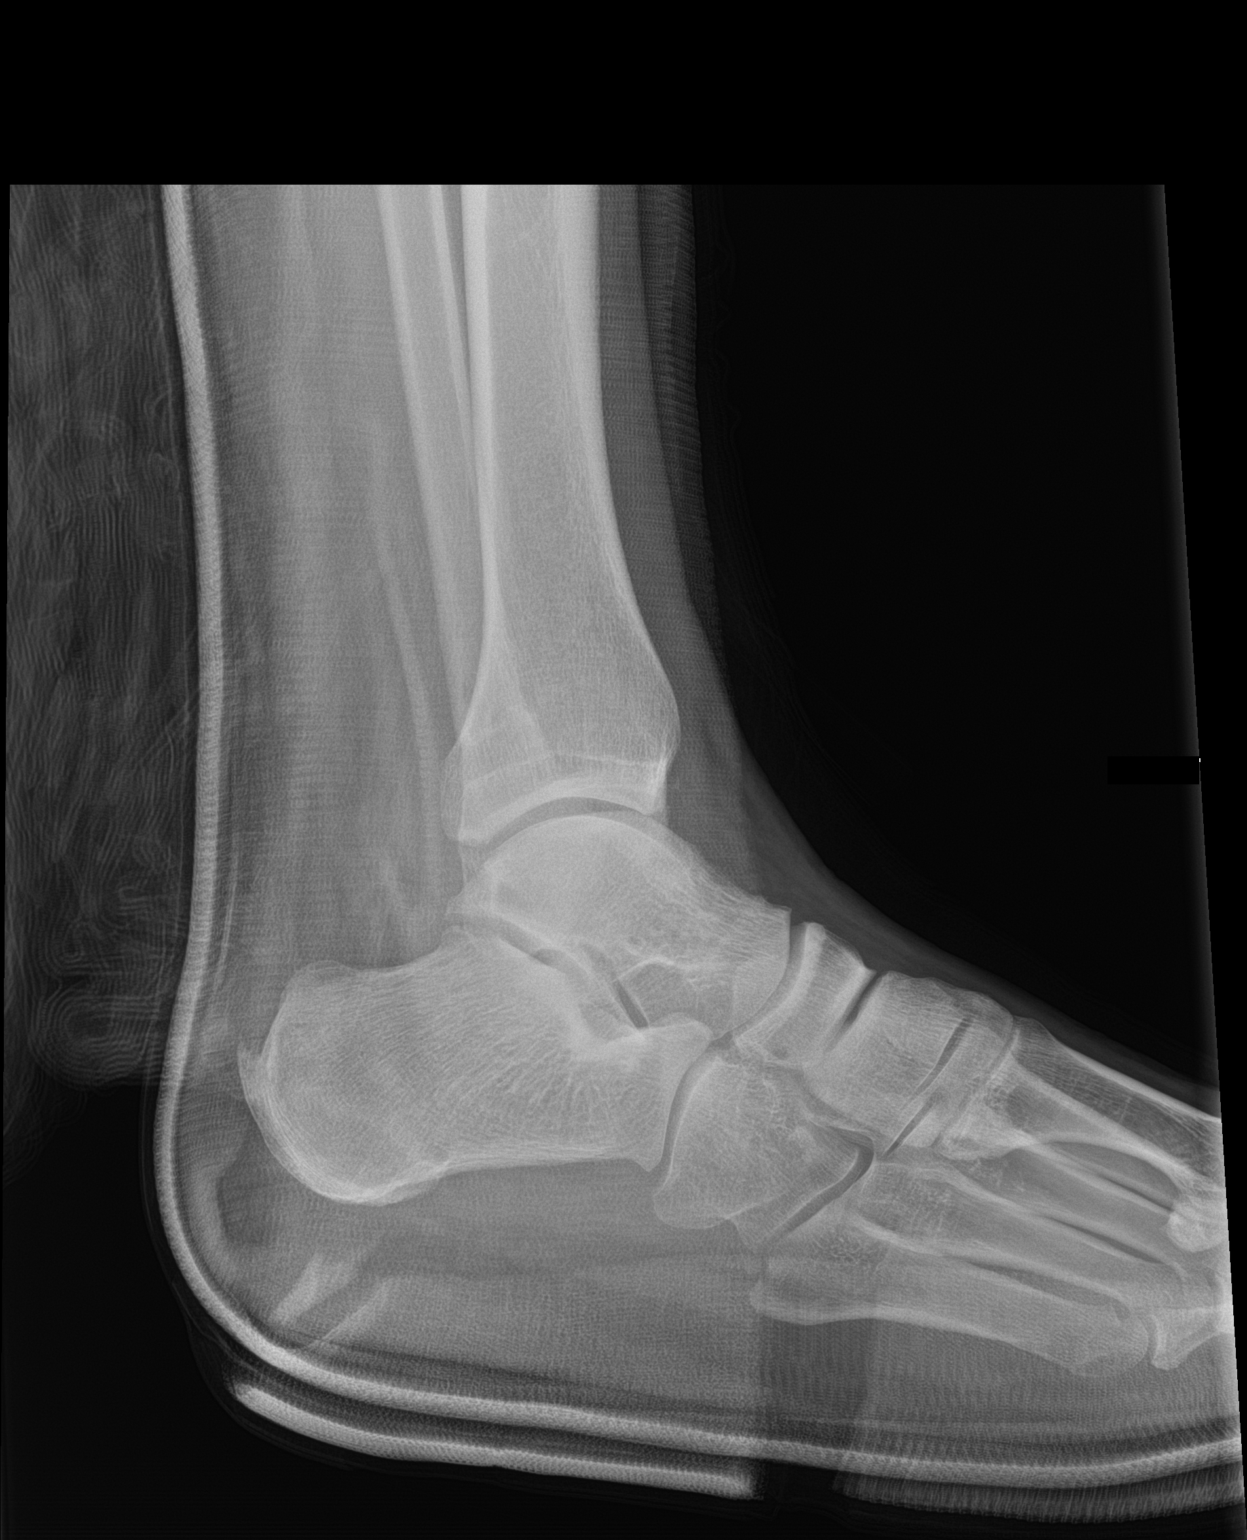

[3 of 3 positions shown; findings below may reference images not displayed]

FINDINGS: Three views were obtained with a fiberglass splint in place. These
demonstrate significantly improved position and alignment of the
fragments of the previously demonstrated comminuted distal fibular
fracture. These are in essentially anatomic position and alignment
on these views. The previously demonstrated subluxation of the talus
relative to the distal tibia has been reduced, currently in normal
alignment.
IMPRESSION: Anatomic position and alignment of the previously demonstrated right
ankle fracture/subluxation, as described above.

## 2023-07-31 ENCOUNTER — Ambulatory Visit
Admission: EM | Admit: 2023-07-31 | Discharge: 2023-07-31 | Disposition: A | Discharge status: Home / Self Care | Payer: Managed Care, Other (non HMO) | Attending: Family Medicine | Admitting: Family Medicine

## 2023-07-31 ENCOUNTER — Other Ambulatory Visit: Payer: Self-pay

## 2023-07-31 DIAGNOSIS — Z23 Encounter for immunization: Secondary | ICD-10-CM | POA: Diagnosis not present

## 2023-07-31 DIAGNOSIS — I10 Essential (primary) hypertension: Secondary | ICD-10-CM | POA: Diagnosis not present

## 2023-07-31 DIAGNOSIS — L03012 Cellulitis of left finger: Secondary | ICD-10-CM | POA: Diagnosis not present

## 2023-07-31 MED ORDER — CEPHALEXIN 500 MG PO CAPS: 500.0000 mg | ORAL_CAPSULE | Freq: Two times a day (BID) | ORAL | 0 refills | Status: DC

## 2023-07-31 MED ORDER — TETANUS-DIPHTH-ACELL PERTUSSIS 5-2.5-18.5 LF-MCG/0.5 IM SUSY
0.5000 mL | PREFILLED_SYRINGE | Freq: Once | INTRAMUSCULAR | Status: AC
  Administered 2023-07-31: 0.5 mL via INTRAMUSCULAR

## 2023-07-31 MED ORDER — IBUPROFEN 800 MG PO TABS: 800.0000 mg | ORAL_TABLET | Freq: Three times a day (TID) | ORAL | 0 refills | Status: DC

## 2023-07-31 MED ORDER — LISINOPRIL-HYDROCHLOROTHIAZIDE 20-12.5 MG PO TABS: 1.0000 | ORAL_TABLET | Freq: Every day | ORAL | 1 refills | Status: DC

## 2023-07-31 NOTE — ED Triage Notes (Signed)
 Pt presents to uc with co of left middle finger swelling and pain at the nail bed. Pt reports worsening over the last week. Pt reports no otc meds for it.
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
## 2023-07-31 NOTE — Discharge Instructions (Signed)
 Take the antibiotic 2 times a day for 5 days Take ibuprofen  3 times a day as needed for pain  It is important that you take blood pressure medication.  Start tonight and take every night I got some blood work to make sure the blood pressure medicine is safe.  We will call you if there are any problems You need to see a primary care doctor.  Call or return for problems

## 2023-08-01 LAB — BASIC METABOLIC PANEL
BUN/Creatinine Ratio: 14 (ref 9–20)
BUN: 14 mg/dL (ref 6–24)
CO2: 24 mmol/L (ref 20–29)
Calcium: 9.2 mg/dL (ref 8.7–10.2)
Chloride: 105 mmol/L (ref 96–106)
Creatinine, Ser: 1.03 mg/dL (ref 0.76–1.27)
Glucose: 111 mg/dL — ABNORMAL HIGH (ref 70–99)
Potassium: 3.6 mmol/L (ref 3.5–5.2)
Sodium: 142 mmol/L (ref 134–144)
eGFR: 86 mL/min/{1.73_m2} (ref >59)

## 2024-02-08 ENCOUNTER — Emergency Department (HOSPITAL_BASED_OUTPATIENT_CLINIC_OR_DEPARTMENT_OTHER)

## 2024-02-08 ENCOUNTER — Encounter (HOSPITAL_BASED_OUTPATIENT_CLINIC_OR_DEPARTMENT_OTHER): Payer: Self-pay | Admitting: Emergency Medicine

## 2024-02-08 ENCOUNTER — Ambulatory Visit
Admission: EM | Admit: 2024-02-08 | Discharge: 2024-02-08 | Disposition: A | Discharge status: Home / Self Care | Attending: Family Medicine | Admitting: Family Medicine

## 2024-02-08 ENCOUNTER — Other Ambulatory Visit: Payer: Self-pay

## 2024-02-08 ENCOUNTER — Emergency Department (HOSPITAL_BASED_OUTPATIENT_CLINIC_OR_DEPARTMENT_OTHER): Admission: EM | Admit: 2024-02-08 | Discharge: 2024-02-08 | Disposition: A | Discharge status: Home / Self Care

## 2024-02-08 DIAGNOSIS — D72829 Elevated white blood cell count, unspecified: Secondary | ICD-10-CM | POA: Diagnosis not present

## 2024-02-08 DIAGNOSIS — I10 Essential (primary) hypertension: Secondary | ICD-10-CM | POA: Insufficient documentation

## 2024-02-08 DIAGNOSIS — R220 Localized swelling, mass and lump, head: Secondary | ICD-10-CM | POA: Diagnosis present

## 2024-02-08 DIAGNOSIS — I161 Hypertensive emergency: Secondary | ICD-10-CM

## 2024-02-08 DIAGNOSIS — K1379 Other lesions of oral mucosa: Secondary | ICD-10-CM | POA: Diagnosis not present

## 2024-02-08 DIAGNOSIS — Z79899 Other long term (current) drug therapy: Secondary | ICD-10-CM | POA: Diagnosis not present

## 2024-02-08 DIAGNOSIS — J039 Acute tonsillitis, unspecified: Secondary | ICD-10-CM | POA: Insufficient documentation

## 2024-02-08 LAB — BASIC METABOLIC PANEL WITH GFR
Anion gap: 12 (ref 5–15)
BUN: 13 mg/dL (ref 6–20)
CO2: 27 mmol/L (ref 22–32)
Calcium: 9.6 mg/dL (ref 8.9–10.3)
Chloride: 102 mmol/L (ref 98–111)
Creatinine, Ser: 1.12 mg/dL (ref 0.61–1.24)
GFR, Estimated: >60 mL/min (ref >60)
Glucose, Bld: 110 mg/dL — ABNORMAL HIGH (ref 70–99)
Potassium: 3.6 mmol/L (ref 3.5–5.1)
Sodium: 141 mmol/L (ref 135–145)

## 2024-02-08 LAB — CBC
HCT: 41.7 % (ref 39.0–52.0)
Hemoglobin: 14.6 g/dL (ref 13.0–17.0)
MCH: 30.7 pg (ref 26.0–34.0)
MCHC: 35 g/dL (ref 30.0–36.0)
MCV: 87.6 fL (ref 80.0–100.0)
Platelets: 215 K/uL (ref 150–400)
RBC: 4.76 MIL/uL (ref 4.22–5.81)
RDW: 12.9 % (ref 11.5–15.5)
WBC: 11.1 K/uL — ABNORMAL HIGH (ref 4.0–10.5)
nRBC: 0 % (ref 0.0–0.2)

## 2024-02-08 LAB — POCT RAPID STREP A (OFFICE): Rapid Strep A Screen: NEGATIVE

## 2024-02-08 LAB — GROUP A STREP BY PCR: Group A Strep by PCR: NOT DETECTED

## 2024-02-08 LAB — RAPID HIV SCREEN (HIV 1/2 AB+AG)
HIV 1/2 Antibodies: NONREACTIVE
HIV-1 P24 Antigen - HIV24: NONREACTIVE

## 2024-02-08 LAB — MONONUCLEOSIS SCREEN: Mono Screen: NEGATIVE

## 2024-02-08 MED ORDER — KETOROLAC TROMETHAMINE 60 MG/2ML IM SOLN: 60.0000 mg | Freq: Once | INTRAMUSCULAR | Status: DC

## 2024-02-08 MED ORDER — DEXAMETHASONE SODIUM PHOSPHATE 10 MG/ML IJ SOLN
10.0000 mg | Freq: Once | INTRAMUSCULAR | Status: AC
  Administered 2024-02-08: 10 mg via INTRAVENOUS
  Filled 2024-02-08: qty 1

## 2024-02-08 MED ORDER — IOHEXOL 300 MG/ML  SOLN
75.0000 mL | Freq: Once | INTRAMUSCULAR | Status: AC | PRN
  Administered 2024-02-08: 75 mL via INTRAVENOUS

## 2024-02-08 MED ORDER — KETOROLAC TROMETHAMINE 15 MG/ML IJ SOLN
15.0000 mg | Freq: Once | INTRAMUSCULAR | Status: AC
  Administered 2024-02-08: 15 mg via INTRAVENOUS
  Filled 2024-02-08: qty 1

## 2024-02-08 MED ORDER — NYSTATIN 100000 UNIT/ML MT SUSP: 5.0000 mL | Freq: Three times a day (TID) | OROMUCOSAL | 0 refills | Status: AC

## 2024-02-08 MED ORDER — AMOXICILLIN-POT CLAVULANATE 875-125 MG PO TABS
1.0000 | ORAL_TABLET | Freq: Two times a day (BID) | ORAL | 0 refills | Status: DC
Start: 2024-02-08 — End: 2024-05-30

## 2024-02-08 MED ORDER — AMLODIPINE BESYLATE 5 MG PO TABS: 5.0000 mg | ORAL_TABLET | Freq: Every day | ORAL | 0 refills | Status: DC

## 2024-02-08 NOTE — ED Notes (Signed)

## 2024-02-08 NOTE — ED Notes (Signed)
 Patient is being discharged from the Urgent Care and sent to the Emergency Department via pov . Per reagan NP, patient is in need of higher level of care due to concern for abscess and htn emergency . Patient is aware and verbalizes understanding of plan of care.  Vitals:   02/08/24 0830 02/08/24 0832  BP: (S) (!) 208/122 (S) (!) 206/133  Pulse: 89   Resp: 19   Temp: 98.7 F (37.1 C)   SpO2: 98%

## 2024-02-08 NOTE — ED Triage Notes (Signed)
 Pt presents to uc with co sore throat for two days. Pt reports difficulty talking and swallowing. No otc medications.

## 2024-02-08 NOTE — ED Notes (Signed)
 Significant swelling to L face, L upper lip, L superior palate, and throat, white coating on tongue and thick mucosal secretions in mouth. No excessive drooling noted. Pt endorsed trouble swallowing due to pain with the back of his throat. No wheeze or stridor noted at present. Pt advised it feels like it's getting worse gradually. See pictures in chart from UC visit for best visual.

## 2024-02-08 NOTE — Discharge Instructions (Addendum)
 Advised patient to go to [facility] ED now for further evaluation of elevated blood pressure and possible oral mass/tonsillar abscess.

## 2024-02-08 NOTE — ED Provider Notes (Signed)
 [place] [facility] AT [facility] Provider Note   CSN: [id number] Arrival date & time: 02/08/24  9087     Patient presents with: Oral Swelling and Facial Swelling   Stephen Spears is a 58 y.o. male.   This is a 58 year old male presenting emergency department with sore throat.  Reports pain x 2 days.  Painful swallowing, but able to.  No difficulty breathing.  No fevers or chills.  No headache.  Does have swelling to his left upper lip and side of his face.  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  He went to urgent care and advised to come to the emergency department for possible peritonsillar abscess and evaluation of his hypertension.  He is not having any chest pain or shortness of breath.        Prior to Admission medications   Medication Sig Start Date End Date Taking? Authorizing Provider  cephALEXin  (KEFLEX ) 500 MG capsule Take 1 capsule (500 mg total) by mouth 2 (two) times daily. 07/31/23   Maranda Jamee Jacob, MD  ibuprofen  (ADVIL ) 800 MG tablet Take 1 tablet (800 mg total) by mouth 3 (three) times daily. 07/31/23   Maranda Jamee Jacob, MD  lisinopril -hydrochlorothiazide  (ZESTORETIC ) 20-12.5 MG tablet Take 1 tablet by mouth daily. 07/31/23   Maranda Jamee Jacob, MD    Allergies: Patient has no known allergies.    Review of Systems  Updated Vital Signs BP (!) 183/119   Pulse 83   Temp 98.8 F (37.1 C)   Resp 16   SpO2 98%   Physical Exam Vitals and nursing note reviewed.  Constitutional:      General: He is not in acute distress.    Appearance: He is not toxic-appearing.  HENT:     Mouth/Throat:     Mouth: Mucous membranes are moist.     Pharynx: Oropharyngeal exudate present.     Comments: Uvula is midline.  Floor of mouth is soft. Eyes:     Conjunctiva/sclera: Conjunctivae normal.  Cardiovascular:     Rate and Rhythm: Normal rate and regular rhythm.  Pulmonary:     Effort: Pulmonary effort is  normal.     Breath sounds: Normal breath sounds.  Abdominal:     General: There is no distension.     Tenderness: There is no abdominal tenderness. There is no guarding or rebound.  Musculoskeletal:     Cervical back: Normal range of motion.     Right lower leg: No edema.     Left lower leg: No edema.  Lymphadenopathy:     Cervical: Cervical adenopathy present.  Skin:    General: Skin is warm.     Capillary Refill: Capillary refill takes less than 2 seconds.  Neurological:     Mental Status: He is alert and oriented to person, place, and time.  Psychiatric:        Mood and Affect: Mood normal.        Behavior: Behavior normal.     (all labs ordered are listed, but only abnormal results are displayed) Labs Reviewed  CBC - Abnormal; Notable for the following components:      Result Value   WBC 11.1 (*)    All other components within normal limits  BASIC METABOLIC PANEL WITH GFR - Abnormal; Notable for the following components:   Glucose, Bld 110 (*)    All other components within normal limits  GROUP A STREP BY PCR  MONONUCLEOSIS SCREEN  RAPID HIV SCREEN (HIV 1/2 AB+AG)    EKG: None  Radiology: CT Soft Tissue Neck W Contrast Result Date: 02/08/2024 CLINICAL DATA:  Soft tissue infection suspected, pain in throat for 2 days. History of jaw surgery. EXAM: CT NECK WITH CONTRAST TECHNIQUE: Multidetector CT imaging of the neck was performed using the standard protocol following the bolus administration of intravenous contrast. RADIATION DOSE REDUCTION: This exam was performed according to the departmental dose-optimization program which includes automated exposure control, adjustment of the mA and/or kV according to patient size and/or use of iterative reconstruction technique. CONTRAST:  75mL OMNIPAQUE IOHEXOL 300 MG/ML  SOLN COMPARISON:  CT head, maxillofacial, CTA neck 06/01/2017. FINDINGS: Pharynx and larynx: There is edematous appearance of the pharyngeal mucosa. Thickening of  the epiglottis likely reflecting reactive edema. Similar calcifications involving the left palatine tonsil, tonsilliths versus sequelae of prior inflammation. The tonsils are otherwise symmetric in appearance without evidence of peritonsillar fluid collection. The oral cavity, floor of mouth, and base of tongue are unremarkable. No retropharyngeal fluid collection. The supraglottic airway is unremarkable. Symmetric appearance of the vocal folds. Salivary glands: No inflammation, mass, or stone. Thyroid: Subcentimeter nodule in the right thyroid lobe. The thyroid is otherwise unremarkable. Lymph nodes: There are multiple subcentimeter cervical lymph nodes which are slightly increased in size and number compared to studies from 2018. The largest noted in cervical level 2 measures up to 0.8 cm in short axis. Vascular: Mild calcified atherosclerosis at the carotid bifurcations without evidence of high-grade stenosis. Limited intracranial: Limited visualization of intracranial structures without focal acute abnormality. Visualized orbits: Limited visualization of the orbits. Mastoids and visualized paranasal sinuses: Mild mucosal thickening in the maxillary sinuses. Thickening of the right maxillary sinus walls suggestive of mucoperiosteal reaction. Mastoid air cells are clear. Skeleton: Interval plate and screw fixation of mandible fracture. Hardware appears intact. The mandible and maxilla are edentulous. Degenerative changes in the visualized cervical spine with disc space narrowing at multiple levels most pronounced at C5-6. Upper chest: No focal abnormality in the lung apices. Mild wall thickening of the upper thoracic esophagus. Other: There is abnormal soft tissue involving the left buccal space overlying the left lateral aspect of the maxilla which corresponds to region of soft tissue seen on the prior CT and may reflect chronic scar tissue in the setting of prior trauma. There is additional soft tissue overlying  the mandible likely reflecting postsurgical changes. Soft tissue thickening also noted within the left facial soft tissues overlying the maxillary sinus extending over the left aspect of the nose. IMPRESSION: Edematous appearance of the pharyngeal mucosa suggestive of pharyngitis. Additional thickening of the epiglottis likely reflecting reactive edema. No evidence of peritonsillar abscess. Subcentimeter cervical lymph nodes are increased in size and number compared to 2018, likely reactive. Interval plate and screw hardware fixation of mandible fracture. Abnormal soft tissue over the left aspect of the maxilla extending into the left facial soft tissues over the maxillary sinus and left aspect of the nose which is similar to regions of soft tissue swelling on the prior CT and may reflect chronic scar tissue in the setting of prior trauma. Recommend correlation with evidence of infection on exam. Wall thickening of the partially visualized upper thoracic esophagus concerning for esophagitis. Electronically Signed   By: Donnice Mania M.D.   On: 02/08/2024 12:14   DG Chest Portable 1 View Result Date: 02/08/2024 CLINICAL DATA:  Hypertension. EXAM: PORTABLE CHEST 1 VIEW COMPARISON:  June 03, 2017. FINDINGS: The  heart size and mediastinal contours are within normal limits. Both lungs are clear. The visualized skeletal structures are unremarkable. IMPRESSION: No active disease. Electronically Signed   By: Lynwood Landy Raddle M.D.   On: 02/08/2024 09:50     Procedures   Medications Ordered in the ED  dexamethasone  (DECADRON ) injection 10 mg (10 mg Intravenous Given 02/08/24 0959)  ketorolac  (TORADOL ) 15 MG/ML injection 15 mg (15 mg Intravenous Given 02/08/24 1133)  iohexol (OMNIPAQUE) 300 MG/ML solution 75 mL (75 mLs Intravenous Contrast Given 02/08/24 1055)    Clinical Course as of 02/08/24 1237  Fri Feb 08, 2024  1031 Patient reevaluated.  Continues to maintain his airway and secretions.  Mild improvement  with Decadron  thus far.  Will give Toradol  for further. [TY]  1043 Group A Strep by PCR: NOT DETECTED [TY]  1051 Creatinine: 1.12 No AKI [TY]  1051 DG Chest Portable 1 View No pulmonary edema medical review of images. [TY]  1051 DG Chest Portable 1 View FINDINGS: The heart size and mediastinal contours are within normal limits. Both lungs are clear. The visualized skeletal structures are unremarkable.  IMPRESSION: No active disease.   Electronically Signed   By: Lynwood Landy Raddle M.D.   On: 02/08/2024 09:50   [TY]  1224 CT Soft Tissue Neck W Contrast IMPRESSION: Edematous appearance of the pharyngeal mucosa suggestive of pharyngitis. Additional thickening of the epiglottis likely reflecting reactive edema.  No evidence of peritonsillar abscess.  Subcentimeter cervical lymph nodes are increased in size and number compared to 2018, likely reactive.  Interval plate and screw hardware fixation of mandible fracture.  Abnormal soft tissue over the left aspect of the maxilla extending into the left facial soft tissues over the maxillary sinus and left aspect of the nose which is similar to regions of soft tissue swelling on the prior CT and may reflect chronic scar tissue in the setting of prior trauma. Recommend correlation with evidence of infection on exam.  Wall thickening of the partially visualized upper thoracic esophagus concerning for esophagitis.   [TY]    Clinical Course User Index [TY] Neysa Caron PARAS, DO                                 Medical Decision Making This is a 58 year old male presenting emergency department for sore throat as well as hypertension.  He is afebrile nontachycardic, is hypertensive with initial blood pressure 208/131.  Subsequent blood pressures in the 180s systolic.  He is asymptomatic, no headache, stroke symptoms, chest pain shortness of breath.  I suspect some of his BP elevation secondary to pain from his tonsillitis.  I do not  appreciate uvular deviation to suggest peritonsillar abscess that was noted on nurse practitioners note at urgent care today.  No trismus, no muffled voice.  He does have some exudates on his soft palate and tonsils.  His lips/left-sided facial swelling is chronic for him and unchanged.  I do not appreciate angioedema otherwise.  Floor of his mouth is soft, low suspicion for Ludwick's angina.  However out of abundance of caution CT neck obtained.  Results as noted in ED course.  I did give him a dose of Decadron  and Toradol  for symptoms.  His strep test is negative.  Monotest negative.  With his exudates considered candidal esophagitis since above HIV screening, however negative as well.  Otherwise per chart review is immunocompromitent.  From a hypertensive standpoint, asymptomatic.  No evidence of endorgan damage.  Per acep guidelines, no indication for acute lowering.  Will start antihypertensives and have him follow-up with primary doctor.  Given CT findings with possible esophagitis, will give referral to GI for endoscopy as it does seem that the exudates on his posterior pharynx were able to be scraped off increasing the likelihood of Candida.  He has been in the emergency department close to 4 hours, reports that his pain is improved after Decadron  and Toradol .  He is remaining chewing his airway and secretions.  I do not see a benefit from inpatient admission at this time.  Will discharge in stable condition.  Give strict return precautions.  Amount and/or Complexity of Data Reviewed External Data Reviewed:     Details: See above Labs: ordered. Decision-making details documented in ED Course.    Details: Minor leukocytosis, no fever or tachycardia to suggest systemic infection.  Normal electrolytes. Radiology: ordered and independent interpretation performed. Decision-making details documented in ED Course.    Details: Do not appreciate obvious fluid collection on the CT scan.  Risk Prescription  drug management. Decision regarding hospitalization. Diagnosis or treatment significantly limited by social determinants of health. Risk Details: Poor health literacy      Final diagnoses:  None    ED Discharge Orders     None          Neysa Caron PARAS, DO 02/08/24 1237

## 2024-02-08 NOTE — ED Triage Notes (Signed)
 Pt reports swelling that began 2 days ago. Was seen at [facility] and advised to come here. Denies being hypertensive.   BP 208/131  Complains of difficulty swallowing

## 2024-02-08 NOTE — Discharge Instructions (Addendum)
 May take over-the-counter Tylenol  alternating with ibuprofen  for pain.  We are prescribing you antibiotics.  Please take them as prescribed for the full course.  You may also use the mouthwash for further symptom relief.  Return immediately if develop fevers, chills, difficulty breathing, tongue or lip swelling, inability to swallow your own secretions or you develop any new or worsening symptoms that are concerning to you.  Thank you for the opportunity to take care of you in our Emergency Department. You have been diagnosed with high blood pressure, also known as hypertension. This means that the force of blood against the walls of your blood vessels called is too strong. It also means that your heart has to work harder to move the blood. High blood pressure usually has no symptoms, but over time, it can cause serious health problems such as Heart attack and heart failure Stroke Kidney disease and failure Vision loss With the help from your healthcare provider and some important life style changes, you can manage your blood pressure and protect your health. Please read the instructions provided on hypertension, how to manage it and how to check your blood pressure. Additionally, use the blood pressure log provided to record your blood pressures. Take the blood pressure log with you to your primary care doctor so that they can adjust your blood pressure medications if needed. Please read the instructions on follow-up appointment. Return to the ER or Call 911 right away if you have any of these symptoms: Chest pain or shortness of breath Severe headache Weakness, tingling, or numbness of your face, arms, or legs (especially on 1 side of the body) Sudden change in vision Confusion, trouble speaking, or trouble understanding speech
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
## 2024-02-18 ENCOUNTER — Encounter: Payer: Self-pay | Admitting: Gastroenterology

## 2024-04-15 ENCOUNTER — Ambulatory Visit: Admitting: Gastroenterology

## 2024-05-30 ENCOUNTER — Encounter: Payer: Self-pay | Admitting: Gastroenterology

## 2024-05-30 ENCOUNTER — Ambulatory Visit (INDEPENDENT_AMBULATORY_CARE_PROVIDER_SITE_OTHER): Admitting: Gastroenterology

## 2024-05-30 VITALS — BP 140/82 | HR 79 | Ht 72.0 in | Wt 221.2 lb

## 2024-05-30 DIAGNOSIS — R1310 Dysphagia, unspecified: Secondary | ICD-10-CM

## 2024-05-30 DIAGNOSIS — Z1211 Encounter for screening for malignant neoplasm of colon: Secondary | ICD-10-CM

## 2024-05-30 DIAGNOSIS — R933 Abnormal findings on diagnostic imaging of other parts of digestive tract: Secondary | ICD-10-CM | POA: Diagnosis not present

## 2024-05-30 DIAGNOSIS — K625 Hemorrhage of anus and rectum: Secondary | ICD-10-CM | POA: Diagnosis not present

## 2024-05-30 MED ORDER — NA SULFATE-K SULFATE-MG SULF 17.5-3.13-1.6 GM/177ML PO SOLN: 1.0000 | ORAL | 0 refills | Status: DC
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
## 2024-05-30 NOTE — Patient Instructions (Signed)
 We have sent the following medications to your pharmacy for you to pick up at your convenience: Suprep  Follow-up with your primary care physician regarding your blood pressure.   You have been scheduled for an endoscopy and colonoscopy. Please follow the written instructions given to you at your visit today.  If you use inhalers (even only as needed), please bring them with you on the day of your procedure.  DO NOT TAKE 7 DAYS PRIOR TO TEST- Trulicity (dulaglutide) Ozempic, Wegovy (semaglutide) Mounjaro, Zepbound (tirzepatide) Bydureon Bcise (exanatide extended release)  DO NOT TAKE 1 DAY PRIOR TO YOUR TEST Rybelsus (semaglutide) Adlyxin (lixisenatide) Victoza (liraglutide) Byetta (exanatide) ___________________________________________________________________________  Due to recent changes in healthcare laws, you may see the results of your imaging and laboratory studies on MyChart before your provider has had a chance to review them.  We understand that in some cases there may be results that are confusing or concerning to you. Not all laboratory results come back in the same time frame and the provider may be waiting for multiple results in order to interpret others.  Please give us  48 hours in order for your provider to thoroughly review all the results before contacting the office for clarification of your results.   Thank you for choosing me and [facility].  Camie Furbish, PA-C

## 2024-05-31 NOTE — Progress Notes (Signed)
 ____________________________________________________________  Attending physician addendum:  Thank you for sending this case to me. I have reviewed the entire note and agree with the plan.  Entirely reasonable to perform EGD to investigate imaging findings.  The overall clinical picture of his July presentation favors infectious or allergic etiology at that time.  Glad to hear he is feeling better.  Victory Brand, MD  ____________________________________________________________

## 2024-07-01 ENCOUNTER — Encounter: Payer: Self-pay | Admitting: Gastroenterology

## 2024-07-08 ENCOUNTER — Encounter (HOSPITAL_COMMUNITY): Payer: Self-pay | Admitting: *Deleted

## 2024-07-08 ENCOUNTER — Encounter: Payer: Self-pay | Admitting: Gastroenterology

## 2024-07-08 ENCOUNTER — Ambulatory Visit: Admitting: Gastroenterology

## 2024-07-08 ENCOUNTER — Ambulatory Visit (HOSPITAL_COMMUNITY)
Admission: EM | Admit: 2024-07-08 | Discharge: 2024-07-08 | Disposition: A | Discharge status: Home / Self Care | Attending: Emergency Medicine | Admitting: Emergency Medicine

## 2024-07-08 DIAGNOSIS — R933 Abnormal findings on diagnostic imaging of other parts of digestive tract: Secondary | ICD-10-CM

## 2024-07-08 DIAGNOSIS — I10 Essential (primary) hypertension: Secondary | ICD-10-CM

## 2024-07-08 DIAGNOSIS — Z1211 Encounter for screening for malignant neoplasm of colon: Secondary | ICD-10-CM

## 2024-07-08 MED ORDER — LOSARTAN POTASSIUM-HCTZ 50-12.5 MG PO TABS: 1.0000 | ORAL_TABLET | Freq: Every day | ORAL | 1 refills | Status: DC
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
## 2024-07-08 NOTE — Progress Notes (Signed)
 See additional nursing note  I spoke with this patient in the preprocedure area along with our CRNA patient currently is asymptomatic with accelerated hypertension.  No chest pain, dyspnea, visual disturbance or headache.  He does not currently have a primary care provider and has been unable to reach them based on his report.  He was seen at a [facility] minute clinic yesterday with accelerated asymptomatic hypertension as well and advised to go to the ED or his PCP.  We were able to contact the [facility] urgent care on the main hospital campus and they have agreed to see this patient and treat his blood pressure today.  Hopefully they can also help arrange some new primary care follow-up close management of his hypertension.  Patient will contact us  after his blood pressure has been treated so we can reschedule his endoscopic procedures.  VEAR Brand MD
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
## 2024-07-08 NOTE — Discharge Instructions (Addendum)
 I have started you on losartan -hydrochlorothiazide  for your blood pressure today.  Take 1 tablet once daily.  I have provided you a 30-day supply with 1 additional refill which we will get you to your appointment with your new PCP on January 28. In the meantime if you develop severe headache, dizziness, blurred vision, weakness or numbness on one side of your body, new slurred speech/facial drooping, chest pain, shortness of breath, lightheadedness, or passing out please seek immediate medical treatment in the emergency department.

## 2024-07-08 NOTE — ED Triage Notes (Signed)
 Pt states he has been out of his BP meds for several months, He states his PCP is gone out of business. He went to min clinic yesterday and they would not give meds advised to go to ED or PCP. He was scheduled for a EGD/Colon this morning but BP was too high to perform procedure.

## 2024-08-13 ENCOUNTER — Ambulatory Visit: Admitting: Medical

## 2024-08-13 VITALS — BP 150/100 | HR 92 | Ht 72.0 in | Wt 227.6 lb

## 2024-08-13 DIAGNOSIS — Z136 Encounter for screening for cardiovascular disorders: Secondary | ICD-10-CM

## 2024-08-13 DIAGNOSIS — Q825 Congenital non-neoplastic nevus: Secondary | ICD-10-CM

## 2024-08-13 DIAGNOSIS — I10 Essential (primary) hypertension: Secondary | ICD-10-CM | POA: Diagnosis not present

## 2024-08-13 DIAGNOSIS — Z23 Encounter for immunization: Secondary | ICD-10-CM

## 2024-08-13 MED ORDER — AZILSARTAN-CHLORTHALIDONE 40-25 MG PO TABS: 1.0000 | ORAL_TABLET | Freq: Every day | ORAL | 1 refills | Status: AC
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
## 2024-08-19 ENCOUNTER — Other Ambulatory Visit: Payer: Self-pay | Admitting: Medical

## 2024-08-19 MED ORDER — CHLORTHALIDONE 25 MG PO TABS: 12.5000 mg | ORAL_TABLET | Freq: Every day | ORAL | 0 refills | Status: AC

## 2024-08-19 MED ORDER — VALSARTAN 160 MG PO TABS: 160.0000 mg | ORAL_TABLET | Freq: Every day | ORAL | 0 refills | Status: AC

## 2024-08-19 NOTE — Telephone Encounter (Signed)
 Patient advised

## 2024-08-19 NOTE — Telephone Encounter (Signed)
 Insurance apparently would not cover Edarbi chlor so I sent 2 separate medicines valsartan   and chlorthalidone .  Begin valsartan  1 tablet daily.  Begin chlorthalidone  1/2 tablet daily.  Lets recheck on blood pressure in 3 to 4 weeks.  Let me know if there is an issue with either of these medicines at the pharmacy

## 2024-09-10 ENCOUNTER — Ambulatory Visit: Admitting: Medical
